# Patient Record
Sex: Female | Born: 1995 | Race: White | Hispanic: No | Marital: Single | Smoking: Never smoker
Health system: Southern US, Community
[De-identification: ages and names within clinical notes are randomized; demographics above are authoritative.]

## PROBLEM LIST (undated history)

## (undated) DIAGNOSIS — R87619 Unspecified abnormal cytological findings in specimens from cervix uteri: Secondary | ICD-10-CM

## (undated) HISTORY — DX: Unspecified abnormal cytological findings in specimens from cervix uteri: R87.619

## (undated) HISTORY — PX: NO PAST SURGERIES: SHX2092

---
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
## 2015-04-20 ENCOUNTER — Ambulatory Visit: Payer: Medicaid Other | Admitting: Obstetrics & Gynecology

## 2015-05-05 ENCOUNTER — Ambulatory Visit (INDEPENDENT_AMBULATORY_CARE_PROVIDER_SITE_OTHER): Payer: Medicaid Other | Admitting: Obstetrics & Gynecology

## 2015-05-05 ENCOUNTER — Other Ambulatory Visit (HOSPITAL_COMMUNITY)
Admission: RE | Admit: 2015-05-05 | Discharge: 2015-05-05 | Disposition: A | Discharge status: Home / Self Care | Payer: Medicaid Other | Source: Ambulatory Visit | Attending: Obstetrics & Gynecology | Admitting: Obstetrics & Gynecology

## 2015-05-05 ENCOUNTER — Encounter: Payer: Self-pay | Admitting: Obstetrics & Gynecology

## 2015-05-05 VITALS — BP 120/60 | HR 76 | Wt 136.0 lb

## 2015-05-05 DIAGNOSIS — Z01411 Encounter for gynecological examination (general) (routine) with abnormal findings: Secondary | ICD-10-CM | POA: Diagnosis not present

## 2015-05-05 DIAGNOSIS — R87619 Unspecified abnormal cytological findings in specimens from cervix uteri: Secondary | ICD-10-CM | POA: Diagnosis not present

## 2015-05-05 DIAGNOSIS — Z124 Encounter for screening for malignant neoplasm of cervix: Secondary | ICD-10-CM

## 2015-05-05 DIAGNOSIS — R87611 Atypical squamous cells cannot exclude high grade squamous intraepithelial lesion on cytologic smear of cervix (ASC-H): Secondary | ICD-10-CM

## 2015-05-05 MED ORDER — MEDROXYPROGESTERONE ACETATE 104 MG/0.65ML ~~LOC~~ SUSP: 104.0000 mg | SUBCUTANEOUS | Status: DC

## 2015-05-05 NOTE — Addendum Note (Signed)
Addended by: Criss AlvinePULLIAM, CHRYSTAL G on: 05/05/2015 12:04 PM   Modules accepted: Orders

## 2015-05-05 NOTE — Progress Notes (Signed)
Patient ID: Carolyn Ellis, female   DOB: 02/24/1996, 19 y.o.   MRN: [id number] Colposcopy Procedure Note:  Colposcopy Procedure Note  Indications: Pap smear 6 months ago showed: ASC cannot exclude high grade lesion [facility]). The prior pap showed ASC cannot exclude high grade lesion [facility]).  Prior cervical/vaginal disease: CIN 1. Prior cervical treatment: no treatment.  Smoker:  No. New sexual partner:  No.  : time frame:    History of abnormal Pap: yes  Procedure Details  The risks and benefits of the procedure and Written informed consent obtained.  Speculum placed in vagina and excellent visualization of cervix achieved, cervix swabbed x 3 with acetic acid solution.  Findings: Pap performed today  Specimens: Pap  Complications: none.  Plan: Follow colpo if needed

## 2015-05-06 LAB — CYTOLOGY - PAP

## 2016-02-21 ENCOUNTER — Ambulatory Visit (INDEPENDENT_AMBULATORY_CARE_PROVIDER_SITE_OTHER): Payer: Medicaid Other | Admitting: Obstetrics & Gynecology

## 2016-02-21 ENCOUNTER — Encounter: Payer: Self-pay | Admitting: Obstetrics & Gynecology

## 2016-02-21 VITALS — BP 110/74 | HR 82 | Ht 65.0 in | Wt 156.0 lb

## 2016-02-21 DIAGNOSIS — Z30015 Encounter for initial prescription of vaginal ring hormonal contraceptive: Secondary | ICD-10-CM | POA: Diagnosis not present

## 2016-02-21 DIAGNOSIS — Z3009 Encounter for other general counseling and advice on contraception: Secondary | ICD-10-CM

## 2016-02-21 DIAGNOSIS — Z308 Encounter for other contraceptive management: Secondary | ICD-10-CM

## 2016-02-21 MED ORDER — ETONOGESTREL-ETHINYL ESTRADIOL 0.12-0.015 MG/24HR VA RING: VAGINAL_RING | VAGINAL | Status: DC

## 2016-02-21 NOTE — Progress Notes (Signed)
Patient ID: Carolyn Ellis, female   DOB: 11/23/1995, 20 y.o.   MRN: [id number] Blood pressure 110/74, pulse 82, height 5\' 5"  (1.651 m), weight 156 lb (70.761 kg).  Chief Complaint  Patient presents with  . discuss switching from Depo to Nuvaring    Discussed at length regarding depo vs nuva ring  Wants to try it   Discussed the use and changing, she understands     Face to face time:  15 minutes  Greater than 50% of the visit time was spent in counseling and coordination of care with the patient.  The summary and outline of the counseling and care coordination is summarized in the note above.   All questions were answered.

## 2017-02-25 ENCOUNTER — Other Ambulatory Visit: Payer: Self-pay | Admitting: Obstetrics & Gynecology

## 2019-12-08 ENCOUNTER — Telehealth: Payer: Self-pay | Admitting: Adult Health

## 2019-12-08 NOTE — Telephone Encounter (Signed)
Called patient regarding appointment and the following message was left: ° ° °We have you scheduled for an upcoming appointment at our office. At this time, we are still not allowing visitors during the appointment. Otherwise, support persons should remain outside until the visit is complete.  ° °We ask if you are sick, have any symptoms of COVID, have had any exposure to anyone suspected or confirmed of having COVID-19, or are awaiting test results for COVID-19, to call our office as we [place] need to reschedule you for a virtual visit or schedule your appointment for a later date.   ° °Please know we will ask you these questions or similar questions when you arrive for your appointment and understand this is how we are keeping everyone safe.   ° °Also,to keep you safe, please use the provided hand sanitizer when you enter the office. We are asking everyone in the office to wear a mask to help prevent the spread of °germs. If you have a mask of your own, please wear it to your appointment, if not, we are happy to provide one for you. ° °Thank you for understanding and your cooperation.  ° ° °[facility] Staff ° ° ° ° ° °

## 2019-12-09 ENCOUNTER — Encounter: Payer: Self-pay | Admitting: Adult Health

## 2019-12-09 ENCOUNTER — Other Ambulatory Visit: Payer: Self-pay

## 2019-12-09 ENCOUNTER — Ambulatory Visit (INDEPENDENT_AMBULATORY_CARE_PROVIDER_SITE_OTHER): Payer: 59 | Admitting: Adult Health

## 2019-12-09 VITALS — BP 127/79 | HR 100 | Ht 65.0 in | Wt 178.0 lb

## 2019-12-09 DIAGNOSIS — O3680X0 Pregnancy with inconclusive fetal viability, not applicable or unspecified: Secondary | ICD-10-CM | POA: Diagnosis not present

## 2019-12-09 DIAGNOSIS — Z3A01 Less than 8 weeks gestation of pregnancy: Secondary | ICD-10-CM

## 2019-12-09 DIAGNOSIS — Z3201 Encounter for pregnancy test, result positive: Secondary | ICD-10-CM | POA: Diagnosis not present

## 2019-12-09 LAB — POCT URINE PREGNANCY: Preg Test, Ur: POSITIVE — AB

## 2019-12-09 NOTE — Progress Notes (Signed)
  Subjective:     Patient ID: Carolyn Ellis, female   DOB: 05/06/1996, 24 y.o.   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 PCP is [facility].   Review of Systems +missed period,3+HPTs  +cramping, no bleeding Reviewed past medical,surgical, social and family history. Reviewed medications and allergies.     Objective:   Physical Exam BP 127/79 (BP Location: Left Arm, Patient Position: Sitting, Cuff Size: Normal)   Pulse 100   Ht 5\' 5"  (1.651 m)   Wt 178 lb (80.7 kg)   LMP 10/30/2019   BMI 29.62 kg/m UPT +, about 5+5 weeks by LMP with EDD 08/05/20. Skin warm and dry. Neck: mid line trachea, normal thyroid, good ROM, no lymphadenopathy noted. Lungs: clear to ausculation bilaterally. Cardiovascular: regular rate and rhythm. Abdomen is soft and non tender. Fall risk is low PHQ 2 score is 0.     Assessment:     1. Pregnancy examination or test, positive result Take PN Gummies  2. Less than [redacted] weeks gestation of pregnancy  3. Encounter to determine fetal viability of pregnancy, single or unspecified fetus Return in about 2 weeks for dating 10/05/20     Plan:     Review handout by [facility]  Note given for no lifting over 25 lbs

## 2019-12-14 ENCOUNTER — Telehealth: Payer: Self-pay | Admitting: *Deleted

## 2019-12-14 NOTE — Telephone Encounter (Signed)
Patient left message having lower stomach pains and missed four days of work. Please call.

## 2019-12-14 NOTE — Telephone Encounter (Signed)
Patient states she is having lower right side. Denies bleeding. Pain seems to be about the same but is unbearable.  Advised patient she needs an u/s. Will put on schedule for 10:30 in the am but should go to the hospital if pain worsens.  Pt verbalized understanding.

## 2019-12-15 ENCOUNTER — Ambulatory Visit (INDEPENDENT_AMBULATORY_CARE_PROVIDER_SITE_OTHER): Payer: 59

## 2019-12-15 ENCOUNTER — Other Ambulatory Visit: Payer: Self-pay

## 2019-12-15 DIAGNOSIS — O3680X0 Pregnancy with inconclusive fetal viability, not applicable or unspecified: Secondary | ICD-10-CM | POA: Diagnosis not present

## 2019-12-15 DIAGNOSIS — Z3A01 Less than 8 weeks gestation of pregnancy: Secondary | ICD-10-CM

## 2019-12-15 NOTE — Progress Notes (Signed)
[place] 5+5 wks,single IUP with ys,crl 2.8 mm,normal ovaries,fhr 107 bpm

## 2019-12-23 ENCOUNTER — Other Ambulatory Visit: Payer: 59

## 2019-12-24 ENCOUNTER — Other Ambulatory Visit: Payer: Self-pay

## 2019-12-24 ENCOUNTER — Inpatient Hospital Stay (HOSPITAL_COMMUNITY)
Admission: AD | Admit: 2019-12-24 | Discharge: 2019-12-25 | Disposition: A | Discharge status: Home / Self Care | Payer: 59 | Source: Ambulatory Visit | Attending: Obstetrics and Gynecology | Admitting: Obstetrics and Gynecology

## 2019-12-24 ENCOUNTER — Encounter (HOSPITAL_COMMUNITY): Payer: Self-pay | Admitting: Obstetrics and Gynecology

## 2019-12-24 ENCOUNTER — Inpatient Hospital Stay (HOSPITAL_COMMUNITY): Payer: 59

## 2019-12-24 DIAGNOSIS — Z3A01 Less than 8 weeks gestation of pregnancy: Secondary | ICD-10-CM | POA: Insufficient documentation

## 2019-12-24 DIAGNOSIS — O209 Hemorrhage in early pregnancy, unspecified: Secondary | ICD-10-CM | POA: Diagnosis not present

## 2019-12-24 DIAGNOSIS — O26851 Spotting complicating pregnancy, first trimester: Secondary | ICD-10-CM | POA: Diagnosis present

## 2019-12-24 DIAGNOSIS — Z880 Allergy status to penicillin: Secondary | ICD-10-CM | POA: Diagnosis not present

## 2019-12-24 LAB — CBC
HCT: 37.8 % (ref 36.0–46.0)
Hemoglobin: 12.5 g/dL (ref 12.0–15.0)
MCH: 28.3 pg (ref 26.0–34.0)
MCHC: 33.1 g/dL (ref 30.0–36.0)
MCV: 85.5 fL (ref 80.0–100.0)
Platelets: 269 10*3/uL (ref 150–400)
RBC: 4.42 MIL/uL (ref 3.87–5.11)
RDW: 13.4 % (ref 11.5–15.5)
WBC: 12.4 10*3/uL — ABNORMAL HIGH (ref 4.0–10.5)
nRBC: 0 % (ref 0.0–0.2)

## 2019-12-24 NOTE — Progress Notes (Signed)
Swabs obtained blindly without spec

## 2019-12-24 NOTE — MAU Provider Note (Signed)
Chief Complaint: Vaginal Bleeding   First Provider Initiated Contact with Patient 12/24/19 2313        SUBJECTIVE HPI: Carolyn Ellis is a 24 y.o. G1P0 at [redacted]w[redacted]d by LMP who presents to maternity admissions reporting pink spotting yesterday and again today. Has had an early [place] in office at [redacted]w[redacted]d.  No cramping today. . She denies vaginal bleeding, vaginal itching/burning, urinary symptoms, h/a, dizziness, n/v, or fever/chills.    Vaginal Bleeding The patient's primary symptoms include vaginal bleeding. The patient's pertinent negatives include no genital itching, genital lesions, genital odor or pelvic pain. This is a recurrent problem. The current episode started yesterday. The problem occurs intermittently. The problem has been unchanged. The patient is experiencing no pain. She is pregnant. Pertinent negatives include no abdominal pain, back pain, chills, constipation, diarrhea, dysuria, fever, nausea or vomiting. The vaginal discharge was bloody (pink). The vaginal bleeding is spotting. She has not been passing clots. She has not been passing tissue. Nothing aggravates the symptoms. She has tried nothing for the symptoms.   RN Note: Yesterday x1 and today x 1 had pink on tissue when wiped. No pain. Just wanted to be sure baby is ok  Past Medical History:  Diagnosis Date  . Abnormal Pap smear of cervix    ASCUS   No past surgical history on file. Social History   Socioeconomic History  . Marital status: Single    Spouse name: Not on file  . Number of children: Not on file  . Years of education: Not on file  . Highest education level: Not on file  Occupational History  . Not on file  Tobacco Use  . Smoking status: Never Smoker  . Smokeless tobacco: Never Used  Substance and Sexual Activity  . Alcohol use: No    Alcohol/week: 0.0 standard drinks  . Drug use: No  . Sexual activity: Yes    Birth control/protection: None  Other Topics Concern  . Not on file  Social History Narrative   . Not on file   Social Determinants of Health   Financial Resource Strain:   . Difficulty of Paying Living Expenses: Not on file  Food Insecurity:   . Worried About Programme researcher, broadcasting/film/video in the Last Year: Not on file  . Ran Out of Food in the Last Year: Not on file  Transportation Needs:   . Lack of Transportation (Medical): Not on file  . Lack of Transportation (Non-Medical): Not on file  Physical Activity:   . Days of Exercise per Week: Not on file  . Minutes of Exercise per Session: Not on file  Stress:   . Feeling of Stress : Not on file  Social Connections:   . Frequency of Communication with Friends and Family: Not on file  . Frequency of Social Gatherings with Friends and Family: Not on file  . Attends Religious Services: Not on file  . Active Member of Clubs or Organizations: Not on file  . Attends Banker Meetings: Not on file  . Marital Status: Not on file  Intimate Partner Violence:   . Fear of Current or Ex-Partner: Not on file  . Emotionally Abused: Not on file  . Physically Abused: Not on file  . Sexually Abused: Not on file   No current facility-administered medications on file prior to encounter.   No current outpatient medications on file prior to encounter.   Allergies  Allergen Reactions  . Augmentin [Amoxicillin-Pot Clavulanate]   . Penicillins Rash  I have reviewed patient's Past Medical Hx, Surgical Hx, Family Hx, Social Hx, medications and allergies.   ROS:  Review of Systems  Constitutional: Negative for chills and fever.  Gastrointestinal: Negative for abdominal pain, constipation, diarrhea, nausea and vomiting.  Genitourinary: Positive for vaginal bleeding. Negative for dysuria and pelvic pain.  Musculoskeletal: Negative for back pain.   Review of Systems  Other systems negative   Physical Exam  Physical Exam Patient Vitals for the past 24 hrs:  BP Temp Pulse Resp Height Weight  12/24/19 2258 (!) 110/55 -- 93 -- -- --   12/24/19 2256 -- 99.1 F (37.3 C) -- 16 5\' 5"  (1.651 m) 80.3 kg   Constitutional: Well-developed, well-nourished female in no acute distress.  Cardiovascular: normal rate Respiratory: normal effort GI: Abd soft, non-tender. Pos BS x 4 MS: Extremities nontender, no edema, normal ROM Neurologic: Alert and oriented x 4.  GU: Neg CVAT.  PELVIC EXAM: Cervix closed, scant pink discharge, vaginal walls and external genitalia normal  LAB RESULTS Results for orders placed or performed during the hospital encounter of 12/24/19 (from the past 24 hour(s))  Wet prep, genital     Status: Abnormal   Collection Time: 12/24/19 11:16 PM   Specimen: Vaginal  Result Value Ref Range   Yeast Wet Prep HPF POC NONE SEEN NONE SEEN   Trich, Wet Prep NONE SEEN NONE SEEN   Clue Cells Wet Prep HPF POC NONE SEEN NONE SEEN   WBC, Wet Prep HPF POC FEW (A) NONE SEEN   Sperm NONE SEEN   ABO/Rh     Status: None   Collection Time: 12/24/19 11:22 PM  Result Value Ref Range   ABO/RH(D) O POS    No rh immune globuloin      NOT A RH IMMUNE GLOBULIN CANDIDATE, PT RH POSITIVE Performed at [facility], [place] [address], [place], [place] [address]   CBC     Status: Abnormal   Collection Time: 12/24/19 11:22 PM  Result Value Ref Range   WBC 12.4 (H) 4.0 - 10.5 K/uL   RBC 4.42 3.87 - 5.11 MIL/uL   Hemoglobin 12.5 12.0 - 15.0 g/dL   HCT 37.8 36.0 - 46.0 %   MCV 85.5 80.0 - 100.0 fL   MCH 28.3 26.0 - 34.0 pg   MCHC 33.1 30.0 - 36.0 g/dL   RDW 13.4 11.5 - 15.5 %   Platelets 269 150 - 400 K/uL   nRBC 0.0 0.0 - 0.2 %  Urinalysis, Routine w reflex microscopic     Status: Abnormal   Collection Time: 12/24/19 11:38 PM  Result Value Ref Range   Color, Urine YELLOW YELLOW   APPearance CLEAR CLEAR   Specific Gravity, Urine 1.018 1.005 - 1.030   pH 6.0 5.0 - 8.0   Glucose, UA NEGATIVE NEGATIVE mg/dL   Hgb urine dipstick MODERATE (A) NEGATIVE   Bilirubin Urine NEGATIVE NEGATIVE   Ketones, ur NEGATIVE NEGATIVE  mg/dL   Protein, ur NEGATIVE NEGATIVE mg/dL   Nitrite NEGATIVE NEGATIVE   Leukocytes,Ua NEGATIVE NEGATIVE   RBC / HPF 0-5 0 - 5 RBC/hpf   WBC, UA 0-5 0 - 5 WBC/hpf   Bacteria, UA NONE SEEN NONE SEEN   Squamous Epithelial / LPF 0-5 0 - 5   Mucus PRESENT      IMAGING US OB Transvaginal  Result Date: 12/25/2019 CLINICAL DATA:  Vaginal bleeding EXAM: TRANSVAGINAL OB ULTRASOUND TECHNIQUE: Transvaginal ultrasound was performed for complete evaluation of the gestation as  well as the maternal uterus, adnexal regions, and pelvic cul-de-sac. COMPARISON:  None. FINDINGS: Intrauterine gestational sac: Single Yolk sac:  Visualized. Embryo:  Visualized. Cardiac Activity: Visualized. Heart Rate: 148 bpm CRL:   9.4 mm   6 w 6 d                  [place] EDC: 08/12/2020 Subchorionic hemorrhage:  None visualized. Maternal uterus/adnexae: Right ovary measures 3.0 x 1.9 x 2.1 cm and the left ovary measures 2.0 x 1.3 x 2.4 cm. No free fluid. IMPRESSION: 1. Single live intrauterine pregnancy as above, estimated age 32 weeks and 6 days. Electronically Signed   By: Sharlet Salina M.D.   On: 12/25/2019 00:23   MAU Management/MDM: Ordered ABO/Rh. Pelvic exam and cultures done Will check Ultrasound to rule out ectopic. This bleeding can represent a normal pregnancy with bleeding, spontaneous abortion or even an ectopic which can be life-threatening.  The process as listed above helps to determine which of these is present.  Reviewed results.  US showed live single IUP. No evidence of SCH, but there Schillo be a tiny one causing bleeding.  The fact that the baby retained a heartbeat, is reassuring the bleeding is minor  ASSESSMENT [place] Live IUP at [redacted]w[redacted]d by LMP Bleeding in the first trimester  PLAN Discharge home Pelvic rest [place] expect continued spotting for a week or two  Pt stable at time of discharge. Encouraged to return here or to other Urgent Care/ED if she develops worsening of symptoms, increase in pain, fever,  or other concerning symptoms.    Wynelle Bourgeois CNM, MSN Certified Nurse-Midwife 12/24/2019  11:13 PM

## 2019-12-24 NOTE — MAU Note (Signed)
Yesterday x1 and today x 1 had pink on tissue when wiped. No pain. Just wanted to be sure baby is ok

## 2019-12-25 LAB — URINALYSIS, ROUTINE W REFLEX MICROSCOPIC
Bacteria, UA: NONE SEEN
Bilirubin Urine: NEGATIVE
Glucose, UA: NEGATIVE mg/dL
Ketones, ur: NEGATIVE mg/dL
Leukocytes,Ua: NEGATIVE
Nitrite: NEGATIVE
Protein, ur: NEGATIVE mg/dL
Specific Gravity, Urine: 1.018 (ref 1.005–1.030)
pH: 6 (ref 5.0–8.0)

## 2019-12-25 LAB — WET PREP, GENITAL
Clue Cells Wet Prep HPF POC: NONE SEEN
Sperm: NONE SEEN
Trich, Wet Prep: NONE SEEN
Yeast Wet Prep HPF POC: NONE SEEN

## 2019-12-25 LAB — GC/CHLAMYDIA PROBE AMP (~~LOC~~) NOT AT ARMC
Chlamydia: NEGATIVE
Comment: NEGATIVE
Comment: NORMAL
Neisseria Gonorrhea: NEGATIVE

## 2019-12-25 LAB — ABO/RH: ABO/RH(D): O POS

## 2019-12-25 NOTE — Discharge Instructions (Signed)
Vaginal Bleeding During Pregnancy, Second Trimester  A small amount of bleeding (spotting) from the vagina is common during pregnancy. Sometimes the bleeding is normal and is not a sign of problems. In some other cases, it is a sign of something serious. Tell your doctor right away if there is any bleeding from your vagina. Follow these instructions at home: Activity  Follow your doctor's instructions about how active you can be.  If needed, make plans for someone to help with your normal activities.  Do not exercise or do activities that take a lot of effort until your doctor says that this is safe.  Do not lift anything that is heavier than 10 lb (4.5 kg) until your doctor says that this is safe.  Do not have sex or orgasms until your doctor says that this is safe. Medicines  Take over-the-counter and prescription medicines only as told by your doctor.  Do not take aspirin. It can cause bleeding. General instructions  Watch your condition for any changes.  Write down: ? The number of pads you use each day. ? How often you change pads. ? How soaked your pads are.  Do not use tampons.  Do not douche.  If you pass any tissue from your vagina, save it to show to your doctor.  Keep all follow-up visits as told by your doctor. This is important. Contact a doctor if:  You have bleeding in the vagina at any time during pregnancy.  You have cramps.  You have a fever that does not get better with medicine. Get help right away if:  You have very bad cramps in your back or belly (abdomen).  You have contractions.  You have chills.  You pass large clots or a lot of tissue from your vagina.  Your bleeding gets worse.  You feel light-headed.  You feel weak.  You pass out (faint).  You are leaking fluid from your vagina.  You have a gush of fluid from your vagina. Summary  Sometimes vaginal bleeding during pregnancy is normal and is not a problem. Sometimes it [place]  be a sign of something serious.  Tell your doctor about any bleeding from your vagina right away.  Follow your doctor's instructions about how active you can be. You [place] need someone to help you with your normal activities. This information is not intended to replace advice given to you by your health care provider. Make sure you discuss any questions you have with your health care provider. Document Revised: 02/03/2019 Document Reviewed: 01/16/2017 Elsevier Patient Education  2020 [employer]vier Inc.  

## 2019-12-25 NOTE — MAU Note (Signed)
Written and verbal d/c instructions given and understanding voiced. 

## 2020-01-27 ENCOUNTER — Telehealth: Payer: Self-pay | Admitting: Advanced Practice Midwife

## 2020-01-27 NOTE — Telephone Encounter (Signed)
Called patient regarding appointment and the following message was left: ° ° °We have you scheduled for an upcoming appointment at our office. At this time, we are still not allowing visitors during the appointment. Otherwise, support persons should remain outside until the visit is complete.  ° °We ask if you are sick, have any symptoms of COVID, have had any exposure to anyone suspected or confirmed of having COVID-19, or are awaiting test results for COVID-19, to call our office as we [place] need to reschedule you for a virtual visit or schedule your appointment for a later date.   ° °Please know we will ask you these questions or similar questions when you arrive for your appointment and understand this is how we are keeping everyone safe.   ° °Also,to keep you safe, please use the provided hand sanitizer when you enter the office. We are asking everyone in the office to wear a mask to help prevent the spread of °germs. If you have a mask of your own, please wear it to your appointment, if not, we are happy to provide one for you. ° °Thank you for understanding and your cooperation.  ° ° °[facility] Staff ° ° ° ° ° °

## 2020-01-28 ENCOUNTER — Other Ambulatory Visit: Payer: Self-pay | Admitting: Obstetrics and Gynecology

## 2020-01-28 DIAGNOSIS — Z3682 Encounter for antenatal screening for nuchal translucency: Secondary | ICD-10-CM

## 2020-01-28 DIAGNOSIS — Z3400 Encounter for supervision of normal first pregnancy, unspecified trimester: Secondary | ICD-10-CM | POA: Insufficient documentation

## 2020-02-01 ENCOUNTER — Other Ambulatory Visit (HOSPITAL_COMMUNITY)
Admission: RE | Admit: 2020-02-01 | Discharge: 2020-02-01 | Disposition: A | Discharge status: Home / Self Care | Payer: 59 | Source: Ambulatory Visit | Attending: Advanced Practice Midwife | Admitting: Advanced Practice Midwife

## 2020-02-01 ENCOUNTER — Encounter: Payer: Self-pay | Admitting: Advanced Practice Midwife

## 2020-02-01 ENCOUNTER — Other Ambulatory Visit: Payer: Self-pay

## 2020-02-01 ENCOUNTER — Ambulatory Visit (INDEPENDENT_AMBULATORY_CARE_PROVIDER_SITE_OTHER): Payer: 59 | Admitting: Advanced Practice Midwife

## 2020-02-01 ENCOUNTER — Ambulatory Visit: Payer: 59 | Admitting: *Deleted

## 2020-02-01 ENCOUNTER — Ambulatory Visit (INDEPENDENT_AMBULATORY_CARE_PROVIDER_SITE_OTHER): Payer: 59

## 2020-02-01 VITALS — BP 107/67 | HR 85 | Wt 171.0 lb

## 2020-02-01 DIAGNOSIS — Z3A12 12 weeks gestation of pregnancy: Secondary | ICD-10-CM

## 2020-02-01 DIAGNOSIS — Z3401 Encounter for supervision of normal first pregnancy, first trimester: Secondary | ICD-10-CM

## 2020-02-01 DIAGNOSIS — Z3A13 13 weeks gestation of pregnancy: Secondary | ICD-10-CM

## 2020-02-01 DIAGNOSIS — Z3402 Encounter for supervision of normal first pregnancy, second trimester: Secondary | ICD-10-CM

## 2020-02-01 DIAGNOSIS — Z3682 Encounter for antenatal screening for nuchal translucency: Secondary | ICD-10-CM | POA: Diagnosis not present

## 2020-02-01 LAB — POCT URINALYSIS DIPSTICK OB
Blood, UA: NEGATIVE
Glucose, UA: NEGATIVE
Ketones, UA: NEGATIVE
Leukocytes, UA: NEGATIVE
Nitrite, UA: NEGATIVE
POC,PROTEIN,UA: NEGATIVE

## 2020-02-01 MED ORDER — ONDANSETRON 4 MG PO TBDP: 4.0000 mg | ORAL_TABLET | Freq: Four times a day (QID) | ORAL | 2 refills | Status: DC | PRN

## 2020-02-01 MED ORDER — BLOOD PRESSURE MONITOR MISC: 0 refills | Status: DC

## 2020-02-01 NOTE — Patient Instructions (Addendum)
Laurell Roof Henigan, I greatly value your feedback.  If you receive a survey following your visit with [place] today, we appreciate you taking the time to fill it out.  Thanks, Nigel Berthold, DNP, [place]!!! It is now [place] at [facility] ([place], [place] [address]) Entrance located off of [address] parking   Nausea & Vomiting  Have saltine crackers or pretzels by your bed and eat a few bites before you raise your head out of bed in the morning  Eat small frequent meals throughout the day instead of large meals  Drink plenty of fluids throughout the day to stay hydrated, just don't drink a lot of fluids with your meals.  This can make your stomach fill up faster making you feel sick  Do not brush your teeth right after you eat  Products with real ginger are good for nausea, like ginger ale and ginger hard candy Make sure it says made with real ginger!  Sucking on sour candy like lemon heads is also good for nausea  If your prenatal vitamins make you nauseated, take them at night so you will sleep through the nausea  Sea Bands  If you feel like you need medicine for the nausea & vomiting please let us know  If you are unable to keep any fluids or food down please let us know   Constipation  Drink plenty of fluid, preferably water, throughout the day  Eat foods high in fiber such as fruits, vegetables, and grains  Exercise, such as walking, is a good way to keep your bowels regular  Drink warm fluids, especially warm prune juice, or decaf coffee  Eat a 1/2 cup of real oatmeal (not instant), 1/2 cup applesauce, and 1/2-1 cup warm prune juice every day  If needed, you Necaise take Colace (docusate sodium) stool softener once or twice a day to help keep the stool soft.   If you still are having problems with constipation, you Ortmann take Miralax once daily as needed to help keep your bowels regular.   Home  Blood Pressure Monitoring for Patients   Your provider has recommended that you check your blood pressure (BP) at least once a week at home. If you do not have a blood pressure cuff at home, one will be provided for you. Contact your provider if you have not received your monitor within 1 week.   Helpful Tips for Accurate Home Blood Pressure Checks  . Don't smoke, exercise, or drink caffeine 30 minutes before checking your BP . Use the restroom before checking your BP (a full bladder can raise your pressure) . Relax in a comfortable upright chair . Feet on the ground . Left arm resting comfortably on a flat surface at the level of your heart . Legs uncrossed . Back supported . Sit quietly and don't talk . Place the cuff on your bare arm . Adjust snuggly, so that only two fingertips can fit between your skin and the top of the cuff . Check 2 readings separated by at least one minute . Keep a log of your BP readings . For a visual, please reference this diagram: http://ccnc.care/bpdiagram  Provider Name: [facility]     Phone: [phone]  Zone 1: ALL CLEAR  Continue to monitor your symptoms:  . BP reading is less than 140 (top number) or less than 90 (bottom number)  . No right upper stomach  pain . No headaches or seeing spots . No feeling nauseated or throwing up . No swelling in face and hands  Zone 2: CAUTION Call your doctor's office for any of the following:  . BP reading is greater than 140 (top number) or greater than 90 (bottom number)  . Stomach pain under your ribs in the middle or right side . Headaches or seeing spots . Feeling nauseated or throwing up . Swelling in face and hands  Zone 3: EMERGENCY  Seek immediate medical care if you have any of the following:  . BP reading is greater than160 (top number) or greater than 110 (bottom number) . Severe headaches not improving with Tylenol . Serious difficulty catching your breath . Any worsening symptoms  from Zone 2    First Trimester of Pregnancy The first trimester of pregnancy is from week 1 until the end of week 12 (months 1 through 3). A week after a sperm fertilizes an egg, the egg will implant on the wall of the uterus. This embryo will begin to develop into a baby. Genes from you and your partner are forming the baby. The female genes determine whether the baby is a boy or a girl. At 6-8 weeks, the eyes and face are formed, and the heartbeat can be seen on ultrasound. At the end of 12 weeks, all the baby's organs are formed.  Now that you are pregnant, you will want to do everything you can to have a healthy baby. Two of the most important things are to get good prenatal care and to follow your health care provider's instructions. Prenatal care is all the medical care you receive before the baby's birth. This care will help prevent, find, and treat any problems during the pregnancy and childbirth. BODY CHANGES Your body goes through many changes during pregnancy. The changes vary from woman to woman.   You Snelling gain or lose a couple of pounds at first.  You Sochacki feel sick to your stomach (nauseous) and throw up (vomit). If the vomiting is uncontrollable, call your health care provider.  You Cerrato tire easily.  You Pennino develop headaches that can be relieved by medicines approved by your health care provider.  You Hartog urinate more often. Painful urination Rothbauer mean you have a bladder infection.  You Gazzola develop heartburn as a result of your pregnancy.  You Yadao develop constipation because certain hormones are causing the muscles that push waste through your intestines to slow down.  You Mcgowan develop hemorrhoids or swollen, bulging veins (varicose veins).  Your breasts Giel begin to grow larger and become tender. Your nipples Eaddy stick out more, and the tissue that surrounds them (areola) Ogden become darker.  Your gums Dowdell bleed and Hendel be sensitive to brushing and flossing.  Dark spots or  blotches (chloasma, mask of pregnancy) Lukins develop on your face. This will likely fade after the baby is born.  Your menstrual periods will stop.  You Hefner have a loss of appetite.  You Weatherspoon develop cravings for certain kinds of food.  You Federici have changes in your emotions from day to day, such as being excited to be pregnant or being concerned that something Gordy go wrong with the pregnancy and baby.  You Pellecchia have more vivid and strange dreams.  You Firestine have changes in your hair. These can include thickening of your hair, rapid growth, and changes in texture. Some women also have hair loss during or after pregnancy, or hair that  feels dry or thin. Your hair will most likely return to normal after your baby is born. WHAT TO EXPECT AT YOUR PRENATAL VISITS During a routine prenatal visit:  You will be weighed to make sure you and the baby are growing normally.  Your blood pressure will be taken.  Your abdomen will be measured to track your baby's growth.  The fetal heartbeat will be listened to starting around week 10 or 12 of your pregnancy.  Test results from any previous visits will be discussed. Your health care provider Tumminello ask you:  How you are feeling.  If you are feeling the baby move.  If you have had any abnormal symptoms, such as leaking fluid, bleeding, severe headaches, or abdominal cramping.  If you have any questions. Other tests that Mcnicholas be performed during your first trimester include:  Blood tests to find your blood type and to check for the presence of any previous infections. They will also be used to check for low iron levels (anemia) and Rh antibodies. Later in the pregnancy, blood tests for diabetes will be done along with other tests if problems develop.  Urine tests to check for infections, diabetes, or protein in the urine.  An ultrasound to confirm the proper growth and development of the baby.  An amniocentesis to check for possible genetic  problems.  Fetal screens for spina bifida and Down syndrome.  You Gillooly need other tests to make sure you and the baby are doing well. HOME CARE INSTRUCTIONS  Medicines  Follow your health care provider's instructions regarding medicine use. Specific medicines Bromwell be either safe or unsafe to take during pregnancy.  Take your prenatal vitamins as directed.  If you develop constipation, try taking a stool softener if your health care provider approves. Diet  Eat regular, well-balanced meals. Choose a variety of foods, such as meat or vegetable-based protein, fish, milk and low-fat dairy products, vegetables, fruits, and whole grain breads and cereals. Your health care provider will help you determine the amount of weight gain that is right for you.  Avoid raw meat and uncooked cheese. These carry germs that can cause birth defects in the baby.  Eating four or five small meals rather than three large meals a day Vermeer help relieve nausea and vomiting. If you start to feel nauseous, eating a few soda crackers can be helpful. Drinking liquids between meals instead of during meals also seems to help nausea and vomiting.  If you develop constipation, eat more high-fiber foods, such as fresh vegetables or fruit and whole grains. Drink enough fluids to keep your urine clear or pale yellow. Activity and Exercise  Exercise only as directed by your health care provider. Exercising will help you:  Control your weight.  Stay in shape.  Be prepared for labor and delivery.  Experiencing pain or cramping in the lower abdomen or low back is a good sign that you should stop exercising. Check with your health care provider before continuing normal exercises.  Try to avoid standing for long periods of time. Move your legs often if you must stand in one place for a long time.  Avoid heavy lifting.  Wear low-heeled shoes, and practice good posture.  You Mulligan continue to have sex unless your health care  provider directs you otherwise. Relief of Pain or Discomfort  Wear a good support bra for breast tenderness.    Take warm sitz baths to soothe any pain or discomfort caused by hemorrhoids. Use hemorrhoid cream  if your health care provider approves.    Rest with your legs elevated if you have leg cramps or low back pain.  If you develop varicose veins in your legs, wear support hose. Elevate your feet for 15 minutes, 3-4 times a day. Limit salt in your diet. Prenatal Care  Schedule your prenatal visits by the twelfth week of pregnancy. They are usually scheduled monthly at first, then more often in the last 2 months before delivery.  Write down your questions. Take them to your prenatal visits.  Keep all your prenatal visits as directed by your health care provider. Safety  Wear your seat belt at all times when driving.  Make a list of emergency phone numbers, including numbers for family, friends, the hospital, and police and fire departments. General Tips  Ask your health care provider for a referral to a local prenatal education class. Begin classes no later than at the beginning of month 6 of your pregnancy.  Ask for help if you have counseling or nutritional needs during pregnancy. Your health care provider can offer advice or refer you to specialists for help with various needs.  Do not use hot tubs, steam rooms, or saunas.  Do not douche or use tampons or scented sanitary pads.  Do not cross your legs for long periods of time.  Avoid cat litter boxes and soil used by cats. These carry germs that can cause birth defects in the baby and possibly loss of the fetus by miscarriage or stillbirth.  Avoid all smoking, herbs, alcohol, and medicines not prescribed by your health care provider. Chemicals in these affect the formation and growth of the baby.  Schedule a dentist appointment. At home, brush your teeth with a soft toothbrush and be gentle when you floss. SEEK MEDICAL  CARE IF:   You have dizziness.  You have mild pelvic cramps, pelvic pressure, or nagging pain in the abdominal area.  You have persistent nausea, vomiting, or diarrhea.  You have a bad smelling vaginal discharge.  You have pain with urination.  You notice increased swelling in your face, hands, legs, or ankles. SEEK IMMEDIATE MEDICAL CARE IF:   You have a fever.  You are leaking fluid from your vagina.  You have spotting or bleeding from your vagina.  You have severe abdominal cramping or pain.  You have rapid weight gain or loss.  You vomit blood or material that looks like coffee grounds.  You are exposed to [place] measles and have never had them.  You are exposed to fifth disease or chickenpox.  You develop a severe headache.  You have shortness of breath.  You have any kind of trauma, such as from a fall or a car accident. Document Released: 10/09/2001 Document Revised: 03/01/2014 Document Reviewed: 08/25/2013 [facility] Patient Information 2015 [place], [place]. This information is not intended to replace advice given to you by your health care provider. Make sure you discuss any questions you have with your health care provider.  Coronavirus (COVID-19) Are you at risk?  Are you at risk for the Coronavirus (COVID-19)?  To be considered HIGH RISK for Coronavirus (COVID-19), you have to meet the following criteria:  . Traveled to [place], [place], [place], [place] or [place]; or in the [place] to [place], [place], [place], or [place]; and have fever, cough, and shortness of breath within the last 2 weeks of travel OR . Been in close contact with a person diagnosed with COVID-19 within the last 2  weeks and have fever, cough, and shortness of breath . IF YOU DO NOT MEET THESE CRITERIA, YOU ARE CONSIDERED LOW RISK FOR COVID-19.  What to do if you are HIGH RISK for COVID-19?  Marland Kitchen If you are having a medical emergency, call 911. . Seek medical care right away.  Before you go to a doctor's office, urgent care or emergency department, call ahead and tell them about your recent travel, contact with someone diagnosed with COVID-19, and your symptoms. You should receive instructions from your physician's office regarding next steps of care.  . When you arrive at healthcare provider, tell the healthcare staff immediately you have returned from visiting [place], [place], [place], [place] or [place]; or traveled in the [place] to [place], [place], [place], or [place]; in the last two weeks or you have been in close contact with a person diagnosed with COVID-19 in the last 2 weeks.   . Tell the health care staff about your symptoms: fever, cough and shortness of breath. . After you have been seen by a medical provider, you will be either: o Tested for (COVID-19) and discharged home on quarantine except to seek medical care if symptoms worsen, and asked to  - Stay home and avoid contact with others until you get your results (4-5 days)  - Avoid travel on public transportation if possible (such as bus, train, or airplane) or o Sent to the Emergency Department by EMS for evaluation, COVID-19 testing, and possible admission depending on your condition and test results.  What to do if you are LOW RISK for COVID-19?  Reduce your risk of any infection by using the same precautions used for avoiding the common cold or flu:  Marland Kitchen Wash your hands often with soap and warm water for at least 20 seconds.  If soap and water are not readily available, use an alcohol-based hand sanitizer with at least 60% alcohol.  . If coughing or sneezing, cover your mouth and nose by coughing or sneezing into the elbow areas of your shirt or coat, into a tissue or into your sleeve (not your hands). . Avoid shaking hands with others and consider head nods or verbal greetings only. . Avoid touching your eyes, nose, or mouth with unwashed hands.  . Avoid close contact with people who are  sick. . Avoid places or events with large numbers of people in one location, like concerts or sporting events. . Carefully consider travel plans you have or are making. . If you are planning any travel outside or inside the [place], visit the CDC's Travelers' Health webpage for the latest health notices. . If you have some symptoms but not all symptoms, continue to monitor at home and seek medical attention if your symptoms worsen. . If you are having a medical emergency, call 911.   [place] / e-Visit: [email or web address]         [facility]: [facility]: [phone]                   [facility]: [phone]     Safe Medications in Pregnancy   Acne: Benzoyl Peroxide Salicylic Acid  Backache/Headache: Tylenol: 2 regular strength every 4 hours OR              2 Extra strength every 6 hours  Colds/Coughs/Allergies: Benadryl (alcohol free) 25 mg every 6 hours as needed Breath right strips Claritin  Cepacol throat lozenges Chloraseptic throat spray Cold-Eeze- up to three times per day Cough drops, alcohol free Flonase (by prescription only) Guaifenesin Mucinex Robitussin DM (plain only, alcohol free) Saline nasal spray/drops Sudafed (pseudoephedrine) & Actifed ** use only after [redacted] weeks gestation and if you do not have high blood pressure Tylenol Vicks Vaporub Zinc lozenges Zyrtec   Constipation: Colace Ducolax suppositories Fleet enema Glycerin suppositories Metamucil Milk of magnesia Miralax Senokot Smooth move tea  Diarrhea: Kaopectate Imodium A-D  *NO pepto Bismol  Hemorrhoids: Anusol Anusol HC Preparation H Tucks  Indigestion: Tums Maalox Mylanta Zantac  Pepcid  Insomnia: Benadryl (alcohol free) 25mg  every 6 hours as needed Tylenol PM Unisom, no Gelcaps  Leg  Cramps: Tums MagGel  Nausea/Vomiting:  Bonine Dramamine Emetrol Ginger extract Sea bands Meclizine  Nausea medication to take during pregnancy:  Unisom (doxylamine succinate 25 mg tablets) Take one tablet daily at bedtime. If symptoms are not adequately controlled, the dose can be increased to a maximum recommended dose of two tablets daily (1/2 tablet in the morning, 1/2 tablet mid-afternoon and one at bedtime). Vitamin B6 100mg  tablets. Take one tablet twice a day (up to 200 mg per day).  Skin Rashes: Aveeno products Benadryl cream or 25mg  every 6 hours as needed Calamine Lotion 1% cortisone cream  Yeast infection: Gyne-lotrimin 7 Monistat 7   **If taking multiple medications, please check labels to avoid duplicating the same active ingredients **take medication as directed on the label ** Do not exceed 4000 mg of tylenol in 24 hours **Do not take medications that contain aspirin or ibuprofen     For Headaches:   Stay well hydrated, drink enough water so that your urine is clear, sometimes if you are dehydrated you can get headaches  Eat small frequent meals and snacks, sometimes if you are hungry you can get headaches  Sometimes you get headaches during pregnancy from the pregnancy hormones  You can try tylenol (1-2 regular strength 325mg  or 1-2 extra strength 500mg ) as directed on the box. The least amount of medication that works is best.   Cool compresses (cool wet washcloth or ice pack) to area of head that is hurting  You can also try drinking a caffeinated drink to see if this will help  If not helping, try below:  For Prevention of Headaches/Migraines:  CoQ10 100mg  three times daily  Vitamin B2 400mg  daily  Magnesium Oxide 400-600mg  daily  If You Get a Bad Headache/Migraine:  Benadryl 25mg    Magnesium Oxide  1 large Gatorade  2 extra strength Tylenol (1,000mg  total)  1 cup coffee or Coke  If this doesn't help please call @  [phone]

## 2020-02-01 NOTE — Progress Notes (Signed)
INITIAL OBSTETRICAL VISIT Patient name: Carolyn Ellis MRN [id number]  Date of birth: Oct 27, 1996 Chief Complaint:   Initial Prenatal Visit (nt/it)  History of Present Illness:   Carolyn Ellis is a 24 y.o. G1P0 Caucasian female at [redacted]w[redacted]d by 6 week [place] with an Estimated Date of Delivery: 08/11/20 being seen today for her initial obstetrical visit.   Her obstetrical history is significant for first baby.   Today she reports no complaints.  Patient's last menstrual period was 10/30/2019. Last pap unsure. Review of Systems:   Pertinent items are noted in HPI Denies cramping/contractions, leakage of fluid, vaginal bleeding, abnormal vaginal discharge w/ itching/odor/irritation, headaches, visual changes, shortness of breath, chest pain, abdominal pain, severe nausea/vomiting, or problems with urination or bowel movements unless otherwise stated above.  Pertinent History Reviewed:  Reviewed past medical,surgical, social, obstetrical and family history.  Reviewed problem list, medications and allergies. OB History  Gravida Para Term Preterm AB Living  1            SAB TAB Ectopic Multiple Live Births               # Outcome Date GA Lbr Len/2nd Weight Sex Delivery Anes PTL Lv  1 Current            Physical Assessment:   Vitals:   02/01/20 1153  BP: 107/67  Pulse: 85  Weight: 171 lb (77.6 kg)  Body mass index is 28.46 kg/m.       Physical Examination:  General appearance - well appearing, and in no distress  Mental status - alert, oriented to person, place, and time  Psych:  She has a normal mood and affect  Skin - warm and dry, normal color, no suspicious lesions noted  Chest - effort normal, all lung fields clear to auscultation bilaterally  Heart - normal rate and regular rhythm  Abdomen - soft, nontender  Extremities:  No swelling or varicosities noted   Pelvic - VULVA: normal appearing vulva with no masses, tenderness or lesions  VAGINA: normal appearing vagina with normal color and  discharge, no lesions  CERVIX: normal appearing cervix without discharge or lesions, no CMT  Thin prep pap is done with HR HPV cotesting    via [place] 12+4 wks,measurements c/w dates,crl 74.80 mm,anterior placenta,normal ovaries,NB present,NT 2.2 mm  No results found for this or any previous visit (from the past 24 hour(s)).  Assessment & Plan:  1) Low-Risk Pregnancy G1P0 at [redacted]w[redacted]d with an Estimated Date of Delivery: 08/11/20   2) Initial OB visit   Meds: No orders of the defined types were placed in this encounter.   Initial labs obtained Continue prenatal vitamins Reviewed n/v relief measures and warning s/s to report Reviewed recommended weight gain based on pre-gravid BMI Encouraged well-balanced diet Watched video for carrier screening/genetic testing:  Genetic Screening discussed First Screen and Integrated Screen: requested Cystic fibrosis screening requested SMA screening requested Fragile X screening requested Ultrasound discussed; fetal survey: requested CCNC completed  Follow-up: Return in about 3 weeks (around 02/22/2020) for OB Mychart visit.   No orders of the defined types were placed in this encounter.   Jacklyn Shell DNP, CNM 02/01/2020 12:01 PM

## 2020-02-01 NOTE — Progress Notes (Addendum)
[place] 12+4 wks,measurements c/w dates,crl 74.80 mm,anterior placenta,normal ovaries,NB present,NT 2.2 mm

## 2020-02-02 LAB — PMP SCREEN PROFILE (10S), URINE
Amphetamine Scrn, Ur: NEGATIVE ng/mL
BARBITURATE SCREEN URINE: NEGATIVE ng/mL
BENZODIAZEPINE SCREEN, URINE: NEGATIVE ng/mL
CANNABINOIDS UR QL SCN: NEGATIVE ng/mL
Cocaine (Metab) Scrn, Ur: NEGATIVE ng/mL
Creatinine(Crt), U: 90.3 mg/dL (ref 20.0–300.0)
Methadone Screen, Urine: NEGATIVE ng/mL
OXYCODONE+OXYMORPHONE UR QL SCN: NEGATIVE ng/mL
Opiate Scrn, Ur: NEGATIVE ng/mL
Ph of Urine: 7.8 (ref 4.5–8.9)
Phencyclidine Qn, Ur: NEGATIVE ng/mL
Propoxyphene Scrn, Ur: NEGATIVE ng/mL

## 2020-02-03 LAB — URINE CULTURE: Organism ID, Bacteria: NO GROWTH

## 2020-02-04 ENCOUNTER — Encounter: Payer: Self-pay | Admitting: Advanced Practice Midwife

## 2020-02-04 DIAGNOSIS — R87612 Low grade squamous intraepithelial lesion on cytologic smear of cervix (LGSIL): Secondary | ICD-10-CM | POA: Insufficient documentation

## 2020-02-04 LAB — CYTOLOGY - PAP
Chlamydia: NEGATIVE
Comment: NEGATIVE
Comment: NORMAL
Neisseria Gonorrhea: NEGATIVE

## 2020-02-07 LAB — HGB FRACTIONATION CASCADE
Hgb A2: 2.9 % (ref 1.8–3.2)
Hgb A: 97.1 % (ref 96.4–98.8)
Hgb F: 0 % (ref 0.0–2.0)
Hgb S: 0 %

## 2020-02-07 LAB — OBSTETRIC PANEL, INCLUDING HIV
Antibody Screen: NEGATIVE
Basophils Absolute: 0 10*3/uL (ref 0.0–0.2)
Basos: 0 %
EOS (ABSOLUTE): 0.1 10*3/uL (ref 0.0–0.4)
Eos: 1 %
HIV Screen 4th Generation wRfx: NONREACTIVE
Hematocrit: 38.4 % (ref 34.0–46.6)
Hemoglobin: 13 g/dL (ref 11.1–15.9)
Hepatitis B Surface Ag: NEGATIVE
Immature Grans (Abs): 0 10*3/uL (ref 0.0–0.1)
Immature Granulocytes: 0 %
Lymphocytes Absolute: 2 10*3/uL (ref 0.7–3.1)
Lymphs: 17 %
MCH: 29.1 pg (ref 26.6–33.0)
MCHC: 33.9 g/dL (ref 31.5–35.7)
MCV: 86 fL (ref 79–97)
Monocytes Absolute: 0.5 10*3/uL (ref 0.1–0.9)
Monocytes: 5 %
Neutrophils Absolute: 9 10*3/uL — ABNORMAL HIGH (ref 1.4–7.0)
Neutrophils: 77 %
Platelets: 271 10*3/uL (ref 150–450)
RBC: 4.47 x10E6/uL (ref 3.77–5.28)
RDW: 13.6 % (ref 11.7–15.4)
RPR Ser Ql: NONREACTIVE
Rh Factor: POSITIVE
Rubella Antibodies, IGG: 1.64 index (ref >0.99)
WBC: 11.6 10*3/uL — ABNORMAL HIGH (ref 3.4–10.8)

## 2020-02-07 LAB — INTEGRATED 1
Crown Rump Length: 74.8 mm
Gest. Age on Collection Date: 13.3 weeks
Maternal Age at EDD: 24.2 yr
Nuchal Translucency (NT): 2.2 mm
Number of Fetuses: 1
PAPP-A Value: 1757.2 ng/mL
Weight: 171 [lb_av]

## 2020-02-07 LAB — MATERNIT 21 PLUS CORE, BLOOD
Fetal Fraction: 7
Result (T21): NEGATIVE
Trisomy 13 (Patau syndrome): NEGATIVE
Trisomy 18 (Edwards syndrome): NEGATIVE
Trisomy 21 (Down syndrome): NEGATIVE

## 2020-02-07 LAB — HEPATITIS C ANTIBODY: Hep C Virus Ab: <0.1 s/co ratio (ref 0.0–0.9)

## 2020-02-22 ENCOUNTER — Telehealth (INDEPENDENT_AMBULATORY_CARE_PROVIDER_SITE_OTHER): Payer: 59 | Admitting: Women's Health

## 2020-02-22 ENCOUNTER — Other Ambulatory Visit: Payer: Self-pay

## 2020-02-22 ENCOUNTER — Encounter: Payer: Self-pay | Admitting: Women's Health

## 2020-02-22 VITALS — BP 105/71 | HR 103 | Wt 175.0 lb

## 2020-02-22 DIAGNOSIS — Z363 Encounter for antenatal screening for malformations: Secondary | ICD-10-CM

## 2020-02-22 DIAGNOSIS — Z3A15 15 weeks gestation of pregnancy: Secondary | ICD-10-CM

## 2020-02-22 DIAGNOSIS — Z3402 Encounter for supervision of normal first pregnancy, second trimester: Secondary | ICD-10-CM

## 2020-02-22 MED ORDER — DOXYLAMINE-PYRIDOXINE 10-10 MG PO TBEC: DELAYED_RELEASE_TABLET | ORAL | 6 refills | Status: DC

## 2020-02-22 NOTE — Progress Notes (Signed)
TELEHEALTH VIRTUAL OBSTETRICS VISIT ENCOUNTER NOTE Patient name: Carolyn Ellis MRN [id number]  Date of birth: 10/19/1996  I connected with patient on 02/22/20 at  3:30 PM EDT by MyChart video  and verified that I am speaking with the correct person using two identifiers. Due to COVID-19 recommendations, pt is not currently in our office.    I discussed the limitations, risks, security and privacy concerns of performing an evaluation and management service by telephone and the availability of in person appointments. I also discussed with the patient that there Belsky be a patient responsible charge related to this service. The patient expressed understanding and agreed to proceed.  Chief Complaint:   Routine Prenatal Visit (need more zorfan only given 9 tabs)  History of Present Illness:   Carolyn Ellis is a 24 y.o. G1P0 female at [redacted]w[redacted]d with an Estimated Date of Delivery: 08/11/20 being evaluated today for ongoing management of a low-risk pregnancy.  Depression screen [facility] 2/9 02/01/2020 12/09/2019  Decreased Interest 1 0  Down, Depressed, Hopeless 2 0  PHQ - 2 Score 3 0  Altered sleeping 2 -  Tired, decreased energy 3 -  Change in appetite 3 -  Feeling bad or failure about yourself  1 -  Trouble concentrating 0 -  Moving slowly or fidgety/restless 2 -  Suicidal thoughts 0 -  PHQ-9 Score 14 -  Difficult doing work/chores Somewhat difficult -    Today she reports some nausea-zofran helps, pharmacy only gave her 9 out of 30 rx'd, likely all insurance would cover. PHQ 14 last visit, denies dep/anx- states situational d/t FOB leaving her. Contractions: Not present.  .   . denies leaking of fluid. Review of Systems:   Pertinent items are noted in HPI Denies abnormal vaginal discharge w/ itching/odor/irritation, headaches, visual changes, shortness of breath, chest pain, abdominal pain, severe nausea/vomiting, or problems with urination or bowel movements unless otherwise stated above. Pertinent  History Reviewed:  Reviewed past medical,surgical, social, obstetrical and family history.  Reviewed problem list, medications and allergies. Physical Assessment:   Vitals:   02/22/20 1459  BP: 105/71  Pulse: (!) 103  Weight: 175 lb (79.4 kg)  Body mass index is 29.12 kg/m.        Physical Examination:   General:  Alert, oriented and cooperative.   Mental Status: Normal mood and affect perceived. Normal judgment and thought content.  Rest of physical exam deferred due to type of encounter  No results found for this or any previous visit (from the past 24 hour(s)).  Assessment & Plan:  1) Pregnancy G1P0 at [redacted]w[redacted]d with an Estimated Date of Delivery: 08/11/20   2) Nausea, rx diclegis, will see if covered better by her insurance   Meds:  Meds ordered this encounter  Medications  . Doxylamine-Pyridoxine (DICLEGIS) 10-10 MG TBEC    Sig: 2 tabs q hs, if sx persist add 1 tab q am on day 3, if sx persist add 1 tab q afternoon on day 4    Dispense:  100 tablet    Refill:  6    Order Specific Question:   Supervising Provider    Answer:   Lazaro Arms [[id number]]    Labs/procedures today: none  Plan:  Continue routine obstetrical care.  Has home bp cuff.  Check bp weekly, let us know if >140/90.  Next visit: prefers will be in person for anatomy u/s, 2nd IT    Reviewed: Preterm labor symptoms and general obstetric precautions including  but not limited to vaginal bleeding, contractions, leaking of fluid and fetal movement were reviewed in detail with the patient. The patient was advised to call back or seek an in-person office evaluation/go to [facility] at [facility] for any urgent or concerning symptoms. All questions were answered. Please refer to After Visit Summary for other counseling recommendations.    I provided 15 minutes of non-face-to-face time during this encounter.  Follow-up: Return in about 3 weeks (around 03/14/2020) for LROB, 2nd IT, XE:NMMHWKG, in person,  CNM.  Orders Placed This Encounter  Procedures  . US OB Comp + [place] 02/22/2020 3:20 PM

## 2020-02-22 NOTE — Patient Instructions (Signed)
Carolyn Ellis, I greatly value your feedback.  If you receive a survey following your visit with [place] today, we appreciate you taking the time to fill it out.  Thanks, Joellyn Haff, CNM, WHNP-BC  [facility] ([address] [place], [place] [address]) Entrance C, located off of [address] valet parking  Go to [email or web address] to register for FREE online childbirth classes  [place] Pediatricians/Family Doctors:  [facility] [phone]            [facility] [phone]                 [facility] [phone] (usually not accepting new patients unless you have family there already, you are always welcome to call and ask)       [facility] Department [phone]       [facility] Pediatricians/Family Doctors:   [facility]: [phone]  [facility]: [phone]  [facility]: [phone]  [facility] Doctors:   [facility]: [phone]   [facility]: [phone]  [facility] Doctors:  [facility]: [phone]    Home Blood Pressure Monitoring for Patients   Your provider has recommended that you check your blood pressure (BP) at least once a week at home. If you do not have a blood pressure cuff at home, one will be provided for you. Contact your provider if you have not received your monitor within 1 week.   Helpful Tips for Accurate Home Blood Pressure Checks  . Don't smoke, exercise, or drink caffeine 30 minutes before checking your BP . Use the restroom before checking your BP (a full bladder can raise your pressure) . Relax in a comfortable upright chair . Feet on the ground . Left arm resting comfortably on a flat surface at the level of your heart . Legs uncrossed . Back supported . Sit quietly and don't talk . Place the cuff on your bare arm . Adjust snuggly, so that  only two fingertips can fit between your skin and the top of the cuff . Check 2 readings separated by at least one minute . Keep a log of your BP readings . For a visual, please reference this diagram: http://ccnc.care/bpdiagram  Provider Name: [facility]     Phone: [phone]  Zone 1: ALL CLEAR  Continue to monitor your symptoms:  . BP reading is less than 140 (top number) or less than 90 (bottom number)  . No right upper stomach pain . No headaches or seeing spots . No feeling nauseated or throwing up . No swelling in face and hands  Zone 2: CAUTION Call your doctor's office for any of the following:  . BP reading is greater than 140 (top number) or greater than 90 (bottom number)  . Stomach pain under your ribs in the middle or right side . Headaches or seeing spots . Feeling nauseated or throwing up . Swelling in face and hands  Zone 3: EMERGENCY  Seek immediate medical care if you have any of the following:  . BP reading is greater than160 (top number) or greater than 110 (bottom number) . Severe headaches not improving with Tylenol . Serious difficulty catching your breath . Any worsening symptoms from Zone 2     Second Trimester of Pregnancy The second trimester is from week 14 through week 27 (months 4 through 6). The second trimester is often a time when you feel your  best. Your body has adjusted to being pregnant, and you begin to feel better physically. Usually, morning sickness has lessened or quit completely, you Koppelman have more energy, and you Helfrich have an increase in appetite. The second trimester is also a time when the fetus is growing rapidly. At the end of the sixth month, the fetus is about 9 inches long and weighs about 1 pounds. You will likely begin to feel the baby move (quickening) between 16 and 20 weeks of pregnancy. Body changes during your second trimester Your body continues to go through many changes during your second trimester. The changes  vary from woman to woman.  Your weight will continue to increase. You will notice your lower abdomen bulging out.  You Rezabek begin to get stretch marks on your hips, abdomen, and breasts.  You Swantek develop headaches that can be relieved by medicines. The medicines should be approved by your health care provider.  You Candelas urinate more often because the fetus is pressing on your bladder.  You Maryland develop or continue to have heartburn as a result of your pregnancy.  You Barnfield develop constipation because certain hormones are causing the muscles that push waste through your intestines to slow down.  You Stiger develop hemorrhoids or swollen, bulging veins (varicose veins).  You Pallett have back pain. This is caused by: ? Weight gain. ? Pregnancy hormones that are relaxing the joints in your pelvis. ? A shift in weight and the muscles that support your balance.  Your breasts will continue to grow and they will continue to become tender.  Your gums Haberl bleed and Jasper be sensitive to brushing and flossing.  Dark spots or blotches (chloasma, mask of pregnancy) Weaver develop on your face. This will likely fade after the baby is born.  A dark line from your belly button to the pubic area (linea nigra) Emme appear. This will likely fade after the baby is born.  You Zbikowski have changes in your hair. These can include thickening of your hair, rapid growth, and changes in texture. Some women also have hair loss during or after pregnancy, or hair that feels dry or thin. Your hair will most likely return to normal after your baby is born.  What to expect at prenatal visits During a routine prenatal visit:  You will be weighed to make sure you and the fetus are growing normally.  Your blood pressure will be taken.  Your abdomen will be measured to track your baby's growth.  The fetal heartbeat will be listened to.  Any test results from the previous visit will be discussed.  Your health care provider Pierotti  ask you:  How you are feeling.  If you are feeling the baby move.  If you have had any abnormal symptoms, such as leaking fluid, bleeding, severe headaches, or abdominal cramping.  If you are using any tobacco products, including cigarettes, chewing tobacco, and electronic cigarettes.  If you have any questions.  Other tests that Langstaff be performed during your second trimester include:  Blood tests that check for: ? Low iron levels (anemia). ? High blood sugar that affects pregnant women (gestational diabetes) between 79 and 28 weeks. ? Rh antibodies. This is to check for a protein on red blood cells (Rh factor).  Urine tests to check for infections, diabetes, or protein in the urine.  An ultrasound to confirm the proper growth and development of the baby.  An amniocentesis to check for possible genetic problems.  Fetal  screens for spina bifida and Down syndrome.  HIV (human immunodeficiency virus) testing. Routine prenatal testing includes screening for HIV, unless you choose not to have this test.  Follow these instructions at home: Medicines  Follow your health care provider's instructions regarding medicine use. Specific medicines Bredeson be either safe or unsafe to take during pregnancy.  Take a prenatal vitamin that contains at least 600 micrograms (mcg) of folic acid.  If you develop constipation, try taking a stool softener if your health care provider approves. Eating and drinking  Eat a balanced diet that includes fresh fruits and vegetables, whole grains, good sources of protein such as meat, eggs, or tofu, and low-fat dairy. Your health care provider will help you determine the amount of weight gain that is right for you.  Avoid raw meat and uncooked cheese. These carry germs that can cause birth defects in the baby.  If you have low calcium intake from food, talk to your health care provider about whether you should take a daily calcium supplement.  Limit foods  that are high in fat and processed sugars, such as fried and sweet foods.  To prevent constipation: ? Drink enough fluid to keep your urine clear or pale yellow. ? Eat foods that are high in fiber, such as fresh fruits and vegetables, whole grains, and beans. Activity  Exercise only as directed by your health care provider. Most women can continue their usual exercise routine during pregnancy. Try to exercise for 30 minutes at least 5 days a week. Stop exercising if you experience uterine contractions.  Avoid heavy lifting, wear low heel shoes, and practice good posture.  A sexual relationship Semple be continued unless your health care provider directs you otherwise. Relieving pain and discomfort  Wear a good support bra to prevent discomfort from breast tenderness.  Take warm sitz baths to soothe any pain or discomfort caused by hemorrhoids. Use hemorrhoid cream if your health care provider approves.  Rest with your legs elevated if you have leg cramps or low back pain.  If you develop varicose veins, wear support hose. Elevate your feet for 15 minutes, 3-4 times a day. Limit salt in your diet. Prenatal Care  Write down your questions. Take them to your prenatal visits.  Keep all your prenatal visits as told by your health care provider. This is important. Safety  Wear your seat belt at all times when driving.  Make a list of emergency phone numbers, including numbers for family, friends, the hospital, and police and fire departments. General instructions  Ask your health care provider for a referral to a local prenatal education class. Begin classes no later than the beginning of month 6 of your pregnancy.  Ask for help if you have counseling or nutritional needs during pregnancy. Your health care provider can offer advice or refer you to specialists for help with various needs.  Do not use hot tubs, steam rooms, or saunas.  Do not douche or use tampons or scented sanitary  pads.  Do not cross your legs for long periods of time.  Avoid cat litter boxes and soil used by cats. These carry germs that can cause birth defects in the baby and possibly loss of the fetus by miscarriage or stillbirth.  Avoid all smoking, herbs, alcohol, and unprescribed drugs. Chemicals in these products can affect the formation and growth of the baby.  Do not use any products that contain nicotine or tobacco, such as cigarettes and e-cigarettes. If you need help  quitting, ask your health care provider.  Visit your dentist if you have not gone yet during your pregnancy. Use a soft toothbrush to brush your teeth and be gentle when you floss. Contact a health care provider if:  You have dizziness.  You have mild pelvic cramps, pelvic pressure, or nagging pain in the abdominal area.  You have persistent nausea, vomiting, or diarrhea.  You have a bad smelling vaginal discharge.  You have pain when you urinate. Get help right away if:  You have a fever.  You are leaking fluid from your vagina.  You have spotting or bleeding from your vagina.  You have severe abdominal cramping or pain.  You have rapid weight gain or weight loss.  You have shortness of breath with chest pain.  You notice sudden or extreme swelling of your face, hands, ankles, feet, or legs.  You have not felt your baby move in over an hour.  You have severe headaches that do not go away when you take medicine.  You have vision changes. Summary  The second trimester is from week 14 through week 27 (months 4 through 6). It is also a time when the fetus is growing rapidly.  Your body goes through many changes during pregnancy. The changes vary from woman to woman.  Avoid all smoking, herbs, alcohol, and unprescribed drugs. These chemicals affect the formation and growth your baby.  Do not use any tobacco products, such as cigarettes, chewing tobacco, and e-cigarettes. If you need help quitting, ask your  health care provider.  Contact your health care provider if you have any questions. Keep all prenatal visits as told by your health care provider. This is important. This information is not intended to replace advice given to you by your health care provider. Make sure you discuss any questions you have with your health care provider. Document Released: 10/09/2001 Document Revised: 03/22/2016 Document Reviewed: 12/16/2012 Elsevier Interactive Patient Education  2017 Elsevier Inc.  PROTECT YOURSELF & YOUR BABY FROM THE FLU! Because you are pregnant, we at [facility], along with the Centers for Disease Control (CDC), recommend that you receive the flu vaccine to protect yourself and your baby from the flu. The flu is more likely to cause severe illness in pregnant women than in women of reproductive age who are not pregnant. Changes in the immune system, heart, and lungs during pregnancy make pregnant women (and women up to two weeks postpartum) more prone to severe illness from flu, including illness resulting in hospitalization. Flu also Kolden be harmful for a pregnant woman's developing baby. A common flu symptom is fever, which Cullop be associated with neural tube defects and other adverse outcomes for a developing baby. Getting vaccinated can also help protect a baby after birth from flu. (Mom passes antibodies onto the developing baby during her pregnancy.)  A Flu Vaccine is the Best Protection Against Flu Getting a flu vaccine is the first and most important step in protecting against flu. Pregnant women should get a flu shot and not the live attenuated influenza vaccine (LAIV), also known as nasal spray flu vaccine. Flu vaccines given during pregnancy help protect both the mother and her baby from flu. Vaccination has been shown to reduce the risk of flu-associated acute respiratory infection in pregnant women by up to one-half. A 2018 study showed that getting a flu shot reduced a pregnant woman's  risk of being hospitalized with flu by an average of 40 percent. Pregnant women who get a  flu vaccine are also helping to protect their babies from flu illness for the first several months after their birth, when they are too young to get vaccinated.   A Long Record of Safety for Flu Shots in Pregnant Women Flu shots have been given to millions of pregnant women over many years with a good safety record. There is a lot of evidence that flu vaccines can be given safely during pregnancy; though these data are limited for the first trimester. The CDC recommends that pregnant women get vaccinated during any trimester of their pregnancy. It is very important for pregnant women to get the flu shot.   Other Preventive Actions In addition to getting a flu shot, pregnant women should take the same everyday preventive actions the CDC recommends of everyone, including covering coughs, washing hands often, and avoiding people who are sick.  Symptoms and Treatment If you get sick with flu symptoms call your doctor right away. There are antiviral drugs that can treat flu illness and prevent serious flu complications. The CDC recommends prompt treatment for people who have influenza infection or suspected influenza infection and who are at high risk of serious flu complications, such as people with asthma, diabetes (including gestational diabetes), or heart disease. Early treatment of influenza in hospitalized pregnant women has been shown to reduce the length of the hospital stay.  Symptoms Flu symptoms include fever, cough, sore throat, runny or stuffy nose, body aches, headache, chills and fatigue. Some people Maragh also have vomiting and diarrhea. People Copes be infected with the flu and have respiratory symptoms without a fever.  Early Treatment is Important for Pregnant Women Treatment should begin as soon as possible because antiviral drugs work best when started early (within 48 hours after symptoms  start). Antiviral drugs can make your flu illness milder and make you feel better faster. They Kaneshiro also prevent serious health problems that can result from flu illness. Oral oseltamivir (Tamiflu) is the preferred treatment for pregnant women because it has the most studies available to suggest that it is safe and beneficial. Antiviral drugs require a prescription from your provider. Having a fever caused by flu infection or other infections early in pregnancy Bua be linked to birth defects in a baby. In addition to taking antiviral drugs, pregnant women who get a fever should treat their fever with Tylenol (acetaminophen) and contact their provider immediately.  When to [place] If you are pregnant and have any of these signs, seek care immediately:  Difficulty breathing or shortness of breath  Pain or pressure in the chest or abdomen  Sudden dizziness  Confusion  Severe or persistent vomiting  High fever that is not responding to Tylenol (or store brand equivalent)  Decreased or no movement of your baby  SolutionApps.[email or web address]

## 2020-03-04 ENCOUNTER — Ambulatory Visit: Payer: 59

## 2020-03-08 ENCOUNTER — Telehealth: Payer: Self-pay | Admitting: Women's Health

## 2020-03-08 NOTE — Telephone Encounter (Signed)
Has some questions about a medication

## 2020-03-08 NOTE — Telephone Encounter (Signed)
Telephoned patient at home number. Patient states is having diarrhea at time and was afraid that is was nausea medication causing this. Patient states been taking medication for 2 weeks and no problems until now. Advised patient could stop medication and just try Zofran but if problem persisted could discuss with Dr at next visit. Patient voiced understanding.

## 2020-03-15 ENCOUNTER — Ambulatory Visit (INDEPENDENT_AMBULATORY_CARE_PROVIDER_SITE_OTHER): Payer: 59 | Admitting: Women's Health

## 2020-03-15 ENCOUNTER — Ambulatory Visit (INDEPENDENT_AMBULATORY_CARE_PROVIDER_SITE_OTHER): Payer: 59

## 2020-03-15 ENCOUNTER — Encounter: Payer: Self-pay | Admitting: Women's Health

## 2020-03-15 ENCOUNTER — Other Ambulatory Visit: Payer: Self-pay

## 2020-03-15 VITALS — BP 128/78 | HR 99 | Wt 184.6 lb

## 2020-03-15 DIAGNOSIS — Z3A18 18 weeks gestation of pregnancy: Secondary | ICD-10-CM | POA: Diagnosis not present

## 2020-03-15 DIAGNOSIS — R87612 Low grade squamous intraepithelial lesion on cytologic smear of cervix (LGSIL): Secondary | ICD-10-CM

## 2020-03-15 DIAGNOSIS — Z3402 Encounter for supervision of normal first pregnancy, second trimester: Secondary | ICD-10-CM

## 2020-03-15 DIAGNOSIS — Z363 Encounter for antenatal screening for malformations: Secondary | ICD-10-CM | POA: Diagnosis not present

## 2020-03-15 DIAGNOSIS — Z331 Pregnant state, incidental: Secondary | ICD-10-CM

## 2020-03-15 DIAGNOSIS — Z1379 Encounter for other screening for genetic and chromosomal anomalies: Secondary | ICD-10-CM

## 2020-03-15 DIAGNOSIS — Z1389 Encounter for screening for other disorder: Secondary | ICD-10-CM

## 2020-03-15 LAB — POCT URINALYSIS DIPSTICK OB
Blood, UA: NEGATIVE
Glucose, UA: NEGATIVE
Ketones, UA: NEGATIVE
Leukocytes, UA: NEGATIVE
Nitrite, UA: NEGATIVE
POC,PROTEIN,UA: NEGATIVE

## 2020-03-15 NOTE — Patient Instructions (Signed)
Carolyn Ellis, I greatly value your feedback.  If you receive a survey following your visit with us today, we appreciate you taking the time to fill it out.  Thanks, Kim Zorawar Strollo, CNM, WHNP-BC  [facility] ([address] [place], [place] [address]) Entrance C, located off of [address] Free 24/7 valet parking  Go to [email or web address] to register for FREE online childbirth classes  [place] Pediatricians/Family Doctors:  [facility] [phone]            [facility] [phone]                 [facility] [phone] (usually not accepting new patients unless you have family there already, you are always welcome to call and ask)       [facility] [phone]       [place] Pediatricians/Family Doctors:   [facility]: [phone]  [facility]: [phone]  [facility]: [phone]  [place] Family Doctors:   [facility]: [phone]   [facility]: [phone]  [place] Family Doctors:  [facility]: [phone]    Home Blood Pressure Monitoring for Patients   Your provider has recommended that you check your blood pressure (BP) at least once a week at home. If you do not have a blood pressure cuff at home, one will be provided for you. Contact your provider if you have not received your monitor within 1 week.   Helpful Tips for Accurate Home Blood Pressure Checks  . Don't smoke, exercise, or drink caffeine 30 minutes before checking your BP . Use the restroom before checking your BP (a full bladder can raise your pressure) . Relax in a comfortable upright chair . Feet on the ground . Left arm resting comfortably on a flat surface at the level of your heart . Legs uncrossed . Back supported . Sit quietly and don't talk . Place the cuff on your bare arm . Adjust snuggly, so that  only two fingertips can fit between your skin and the top of the cuff . Check 2 readings separated by at least one minute . Keep a log of your BP readings . For a visual, please reference this diagram: http://ccnc.care/bpdiagram  Provider Name: [facility]     Phone: [phone]  Zone 1: ALL CLEAR  Continue to monitor your symptoms:  . BP reading is less than 140 (top number) or less than 90 (bottom number)  . No right upper stomach pain . No headaches or seeing spots . No feeling nauseated or throwing up . No swelling in face and hands  Zone 2: CAUTION Call your doctor's office for any of the following:  . BP reading is greater than 140 (top number) or greater than 90 (bottom number)  . Stomach pain under your ribs in the middle or right side . Headaches or seeing spots . Feeling nauseated or throwing up . Swelling in face and hands  Zone 3: EMERGENCY  Seek immediate medical care if you have any of the following:  . BP reading is greater than160 (top number) or greater than 110 (bottom number) . Severe headaches not improving with Tylenol . Serious difficulty catching your breath . Any worsening symptoms from Zone 2     Second Trimester of Pregnancy The second trimester is from week 14 through week 27 (months 4 through 6). The second trimester is often a time when you feel your   best. Your body has adjusted to being pregnant, and you begin to feel better physically. Usually, morning sickness has lessened or quit completely, you Villagomez have more energy, and you Storti have an increase in appetite. The second trimester is also a time when the fetus is growing rapidly. At the end of the sixth month, the fetus is about 9 inches long and weighs about 1 pounds. You will likely begin to feel the baby move (quickening) between 16 and 20 weeks of pregnancy. Body changes during your second trimester Your body continues to go through many changes during your second trimester. The changes  vary from woman to woman.  Your weight will continue to increase. You will notice your lower abdomen bulging out.  You Brecheen begin to get stretch marks on your hips, abdomen, and breasts.  You Crist develop headaches that can be relieved by medicines. The medicines should be approved by your health care provider.  You Breunig urinate more often because the fetus is pressing on your bladder.  You Romaniello develop or continue to have heartburn as a result of your pregnancy.  You Thane develop constipation because certain hormones are causing the muscles that push waste through your intestines to slow down.  You Noteboom develop hemorrhoids or swollen, bulging veins (varicose veins).  You Scheiderer have back pain. This is caused by: ? Weight gain. ? Pregnancy hormones that are relaxing the joints in your pelvis. ? A shift in weight and the muscles that support your balance.  Your breasts will continue to grow and they will continue to become tender.  Your gums Chien bleed and Abts be sensitive to brushing and flossing.  Dark spots or blotches (chloasma, mask of pregnancy) Shreffler develop on your face. This will likely fade after the baby is born.  A dark line from your belly button to the pubic area (linea nigra) Gorum appear. This will likely fade after the baby is born.  You Santillo have changes in your hair. These can include thickening of your hair, rapid growth, and changes in texture. Some women also have hair loss during or after pregnancy, or hair that feels dry or thin. Your hair will most likely return to normal after your baby is born.  What to expect at prenatal visits During a routine prenatal visit:  You will be weighed to make sure you and the fetus are growing normally.  Your blood pressure will be taken.  Your abdomen will be measured to track your baby's growth.  The fetal heartbeat will be listened to.  Any test results from the previous visit will be discussed.  Your health care provider Selinger  ask you:  How you are feeling.  If you are feeling the baby move.  If you have had any abnormal symptoms, such as leaking fluid, bleeding, severe headaches, or abdominal cramping.  If you are using any tobacco products, including cigarettes, chewing tobacco, and electronic cigarettes.  If you have any questions.  Other tests that Macias be performed during your second trimester include:  Blood tests that check for: ? Low iron levels (anemia). ? High blood sugar that affects pregnant women (gestational diabetes) between 79 and 28 weeks. ? Rh antibodies. This is to check for a protein on red blood cells (Rh factor).  Urine tests to check for infections, diabetes, or protein in the urine.  An ultrasound to confirm the proper growth and development of the baby.  An amniocentesis to check for possible genetic problems.  Fetal  screens for spina bifida and Down syndrome.  HIV (human immunodeficiency virus) testing. Routine prenatal testing includes screening for HIV, unless you choose not to have this test.  Follow these instructions at home: Medicines  Follow your health care provider's instructions regarding medicine use. Specific medicines Lanza be either safe or unsafe to take during pregnancy.  Take a prenatal vitamin that contains at least 600 micrograms (mcg) of folic acid.  If you develop constipation, try taking a stool softener if your health care provider approves. Eating and drinking  Eat a balanced diet that includes fresh fruits and vegetables, whole grains, good sources of protein such as meat, eggs, or tofu, and low-fat dairy. Your health care provider will help you determine the amount of weight gain that is right for you.  Avoid raw meat and uncooked cheese. These carry germs that can cause birth defects in the baby.  If you have low calcium intake from food, talk to your health care provider about whether you should take a daily calcium supplement.  Limit foods  that are high in fat and processed sugars, such as fried and sweet foods.  To prevent constipation: ? Drink enough fluid to keep your urine clear or pale yellow. ? Eat foods that are high in fiber, such as fresh fruits and vegetables, whole grains, and beans. Activity  Exercise only as directed by your health care provider. Most women can continue their usual exercise routine during pregnancy. Try to exercise for 30 minutes at least 5 days a week. Stop exercising if you experience uterine contractions.  Avoid heavy lifting, wear low heel shoes, and practice good posture.  A sexual relationship Carda be continued unless your health care provider directs you otherwise. Relieving pain and discomfort  Wear a good support bra to prevent discomfort from breast tenderness.  Take warm sitz baths to soothe any pain or discomfort caused by hemorrhoids. Use hemorrhoid cream if your health care provider approves.  Rest with your legs elevated if you have leg cramps or low back pain.  If you develop varicose veins, wear support hose. Elevate your feet for 15 minutes, 3-4 times a day. Limit salt in your diet. Prenatal Care  Write down your questions. Take them to your prenatal visits.  Keep all your prenatal visits as told by your health care provider. This is important. Safety  Wear your seat belt at all times when driving.  Make a list of emergency phone numbers, including numbers for family, friends, the hospital, and police and fire departments. General instructions  Ask your health care provider for a referral to a local prenatal education class. Begin classes no later than the beginning of month 6 of your pregnancy.  Ask for help if you have counseling or nutritional needs during pregnancy. Your health care provider can offer advice or refer you to specialists for help with various needs.  Do not use hot tubs, steam rooms, or saunas.  Do not douche or use tampons or scented sanitary  pads.  Do not cross your legs for long periods of time.  Avoid cat litter boxes and soil used by cats. These carry germs that can cause birth defects in the baby and possibly loss of the fetus by miscarriage or stillbirth.  Avoid all smoking, herbs, alcohol, and unprescribed drugs. Chemicals in these products can affect the formation and growth of the baby.  Do not use any products that contain nicotine or tobacco, such as cigarettes and e-cigarettes. If you need help  quitting, ask your health care provider.  Visit your dentist if you have not gone yet during your pregnancy. Use a soft toothbrush to brush your teeth and be gentle when you floss. Contact a health care provider if:  You have dizziness.  You have mild pelvic cramps, pelvic pressure, or nagging pain in the abdominal area.  You have persistent nausea, vomiting, or diarrhea.  You have a bad smelling vaginal discharge.  You have pain when you urinate. Get help right away if:  You have a fever.  You are leaking fluid from your vagina.  You have spotting or bleeding from your vagina.  You have severe abdominal cramping or pain.  You have rapid weight gain or weight loss.  You have shortness of breath with chest pain.  You notice sudden or extreme swelling of your face, hands, ankles, feet, or legs.  You have not felt your baby move in over an hour.  You have severe headaches that do not go away when you take medicine.  You have vision changes. Summary  The second trimester is from week 14 through week 27 (months 4 through 6). It is also a time when the fetus is growing rapidly.  Your body goes through many changes during pregnancy. The changes vary from woman to woman.  Avoid all smoking, herbs, alcohol, and unprescribed drugs. These chemicals affect the formation and growth your baby.  Do not use any tobacco products, such as cigarettes, chewing tobacco, and e-cigarettes. If you need help quitting, ask your  health care provider.  Contact your health care provider if you have any questions. Keep all prenatal visits as told by your health care provider. This is important. This information is not intended to replace advice given to you by your health care provider. Make sure you discuss any questions you have with your health care provider. Document Released: 10/09/2001 Document Revised: 03/22/2016 Document Reviewed: 12/16/2012 Elsevier Interactive Patient Education  2017 Elsevier Inc.  PROTECT YOURSELF & YOUR BABY FROM THE FLU! Because you are pregnant, we at [facility], along with the Centers for Disease Control (CDC), recommend that you receive the flu vaccine to protect yourself and your baby from the flu. The flu is more likely to cause severe illness in pregnant women than in women of reproductive age who are not pregnant. Changes in the immune system, heart, and lungs during pregnancy make pregnant women (and women up to two weeks postpartum) more prone to severe illness from flu, including illness resulting in hospitalization. Flu also Tuminello be harmful for a pregnant woman's developing baby. A common flu symptom is fever, which Nielson be associated with neural tube defects and other adverse outcomes for a developing baby. Getting vaccinated can also help protect a baby after birth from flu. (Mom passes antibodies onto the developing baby during her pregnancy.)  A Flu Vaccine is the Best Protection Against Flu Getting a flu vaccine is the first and most important step in protecting against flu. Pregnant women should get a flu shot and not the live attenuated influenza vaccine (LAIV), also known as nasal spray flu vaccine. Flu vaccines given during pregnancy help protect both the mother and her baby from flu. Vaccination has been shown to reduce the risk of flu-associated acute respiratory infection in pregnant women by up to one-half. A 2018 study showed that getting a flu shot reduced a pregnant woman's  risk of being hospitalized with flu by an average of 40 percent. Pregnant women who get a  flu vaccine are also helping to protect their babies from flu illness for the first several months after their birth, when they are too young to get vaccinated.   A Long Record of Safety for Flu Shots in Pregnant Women Flu shots have been given to millions of pregnant women over many years with a good safety record. There is a lot of evidence that flu vaccines can be given safely during pregnancy; though these data are limited for the first trimester. The CDC recommends that pregnant women get vaccinated during any trimester of their pregnancy. It is very important for pregnant women to get the flu shot.   Other Preventive Actions In addition to getting a flu shot, pregnant women should take the same everyday preventive actions the CDC recommends of everyone, including covering coughs, washing hands often, and avoiding people who are sick.  Symptoms and Treatment If you get sick with flu symptoms call your doctor right away. There are antiviral drugs that can treat flu illness and prevent serious flu complications. The CDC recommends prompt treatment for people who have influenza infection or suspected influenza infection and who are at high risk of serious flu complications, such as people with asthma, diabetes (including gestational diabetes), or heart disease. Early treatment of influenza in hospitalized pregnant women has been shown to reduce the length of the hospital stay.  Symptoms Flu symptoms include fever, cough, sore throat, runny or stuffy nose, body aches, headache, chills and fatigue. Some people Maragh also have vomiting and diarrhea. People Copes be infected with the flu and have respiratory symptoms without a fever.  Early Treatment is Important for Pregnant Women Treatment should begin as soon as possible because antiviral drugs work best when started early (within 48 hours after symptoms  start). Antiviral drugs can make your flu illness milder and make you feel better faster. They Kaneshiro also prevent serious health problems that can result from flu illness. Oral oseltamivir (Tamiflu) is the preferred treatment for pregnant women because it has the most studies available to suggest that it is safe and beneficial. Antiviral drugs require a prescription from your provider. Having a fever caused by flu infection or other infections early in pregnancy Bua be linked to birth defects in a baby. In addition to taking antiviral drugs, pregnant women who get a fever should treat their fever with Tylenol (acetaminophen) and contact their provider immediately.  When to [place] If you are pregnant and have any of these signs, seek care immediately:  Difficulty breathing or shortness of breath  Pain or pressure in the chest or abdomen  Sudden dizziness  Confusion  Severe or persistent vomiting  High fever that is not responding to Tylenol (or store brand equivalent)  Decreased or no movement of your baby  [email or web address]

## 2020-03-15 NOTE — Progress Notes (Signed)
[place] 18+6 wks,cephalic,cx 5.3 cm,anterior placenta gr 0,normal ovaries,svp of fluid 4.5 cm,LVEICF 1.8 mm,fhr 140 bpm,anatomy complete,efw 321 g 97%

## 2020-03-15 NOTE — Progress Notes (Signed)
LOW-RISK PREGNANCY VISIT Patient name: Carolyn Ellis MRN [id number]  Date of birth: [birth year] Chief Complaint:   Routine Prenatal Visit  History of Present Illness:   Carolyn Ellis is a 24 y.o. G1P0 female at [redacted]w[redacted]d with an Estimated Date of Delivery: 08/11/20 being seen today for ongoing management of a low-risk pregnancy.  Depression screen [facility] 2/9 02/01/2020 12/09/2019  Decreased Interest 1 0  Down, Depressed, Hopeless 2 0  PHQ - 2 Score 3 0  Altered sleeping 2 -  Tired, decreased energy 3 -  Change in appetite 3 -  Feeling bad or failure about yourself  1 -  Trouble concentrating 0 -  Moving slowly or fidgety/restless 2 -  Suicidal thoughts 0 -  PHQ-9 Score 14 -  Difficult doing work/chores Somewhat difficult -    Today she reports no complaints. Contractions: Not present. Vag. Bleeding: None.   . denies leaking of fluid. Review of Systems:   Pertinent items are noted in HPI Denies abnormal vaginal discharge w/ itching/odor/irritation, headaches, visual changes, shortness of breath, chest pain, abdominal pain, severe nausea/vomiting, or problems with urination or bowel movements unless otherwise stated above. Pertinent History Reviewed:  Reviewed past medical,surgical, social, obstetrical and family history.  Reviewed problem list, medications and allergies. Physical Assessment:   Vitals:   03/15/20 1428  BP: 128/78  Pulse: 99  Weight: 184 lb 9.6 oz (83.7 kg)  Body mass index is 30.72 kg/m.        Physical Examination:   General appearance: Well appearing, and in no distress  Mental status: Alert, oriented to person, place, and time  Skin: Warm & dry  Cardiovascular: Normal heart rate noted  Respiratory: Normal respiratory effort, no distress  Abdomen: Soft, gravid, nontender  Pelvic: Cervical exam deferred         Extremities: Edema: None  Fetal Status:         [place] 18+6 wks,cephalic,cx 5.3 cm,anterior placenta gr 0,normal ovaries,svp of fluid 4.5 cm,LVEICF 1.8 mm,fhr  140 bpm,anatomy complete,efw 321 g 97%  Chaperone: n/a    Results for orders placed or performed in visit on 03/15/20 (from the past 24 hour(s))  POC Urinalysis Dipstick OB   Collection Time: 03/15/20  2:27 PM  Result Value Ref Range   Color, UA     Clarity, UA     Glucose, UA Negative Negative   Bilirubin, UA     Ketones, UA n    Spec Grav, UA     Blood, UA n    pH, UA     POC,PROTEIN,UA Negative Negative, Trace, Small (1+), Moderate (2+), Large (3+), 4+   Urobilinogen, UA     Nitrite, UA n    Leukocytes, UA Negative Negative   Appearance     Odor      Assessment & Plan:  1) Low-risk pregnancy G1P0 at [redacted]w[redacted]d with an Estimated Date of Delivery: 08/11/20   2) Isolated fetal EICF, neg NIPS, discussed and gave printed info   Meds: No orders of the defined types were placed in this encounter.  Labs/procedures today: 2nd IT, anatomy u/s  Plan:  Continue routine obstetrical care  Next visit: prefers in person    Reviewed: Preterm labor symptoms and general obstetric precautions including but not limited to vaginal bleeding, contractions, leaking of fluid and fetal movement were reviewed in detail with the patient.  All questions were answered. Has home bp cuff. Check bp weekly, let us know if >140/90.   Follow-up: Return in  about 4 weeks (around 04/12/2020) for LROB, CNM, in person.  Orders Placed This Encounter  Procedures  . INTEGRATED 2  . POC Urinalysis Dipstick OB   Roma Schanz CNM, [facility] 03/15/2020 3:10 PM

## 2020-03-17 LAB — INTEGRATED 2
AFP MoM: 0.62
Alpha-Fetoprotein: 28.7 ng/mL
Crown Rump Length: 74.8 mm
DIA MoM: 1.02
DIA Value: 162.4 pg/mL
Estriol, Unconjugated: 2.39 ng/mL
Gest. Age on Collection Date: 13.3 weeks
Gestational Age: 19.4 weeks
Maternal Age at EDD: 24.2 yr
Nuchal Translucency (NT): 2.2 mm
Nuchal Translucency MoM: 1.28
Number of Fetuses: 1
PAPP-A MoM: 1.55
PAPP-A Value: 1757.2 ng/mL
Test Results:: NEGATIVE
Weight: 171 [lb_av]
Weight: 171 [lb_av]
hCG MoM: 0.94
hCG Value: 19.2 IU/mL
uE3 MoM: 1.28

## 2020-03-22 ENCOUNTER — Encounter: Payer: Self-pay | Admitting: *Deleted

## 2020-04-12 ENCOUNTER — Other Ambulatory Visit (HOSPITAL_COMMUNITY)
Admission: RE | Admit: 2020-04-12 | Discharge: 2020-04-12 | Disposition: A | Discharge status: Home / Self Care | Payer: 59 | Source: Ambulatory Visit | Attending: Obstetrics and Gynecology | Admitting: Obstetrics and Gynecology

## 2020-04-12 ENCOUNTER — Encounter: Payer: Self-pay | Admitting: Women's Health

## 2020-04-12 ENCOUNTER — Ambulatory Visit (INDEPENDENT_AMBULATORY_CARE_PROVIDER_SITE_OTHER): Payer: 59 | Admitting: Women's Health

## 2020-04-12 VITALS — BP 125/70 | HR 97 | Wt 197.0 lb

## 2020-04-12 DIAGNOSIS — N898 Other specified noninflammatory disorders of vagina: Secondary | ICD-10-CM

## 2020-04-12 DIAGNOSIS — O26892 Other specified pregnancy related conditions, second trimester: Secondary | ICD-10-CM | POA: Insufficient documentation

## 2020-04-12 DIAGNOSIS — Z3402 Encounter for supervision of normal first pregnancy, second trimester: Secondary | ICD-10-CM | POA: Insufficient documentation

## 2020-04-12 DIAGNOSIS — Z3A22 22 weeks gestation of pregnancy: Secondary | ICD-10-CM

## 2020-04-12 DIAGNOSIS — Z1389 Encounter for screening for other disorder: Secondary | ICD-10-CM

## 2020-04-12 DIAGNOSIS — Z331 Pregnant state, incidental: Secondary | ICD-10-CM

## 2020-04-12 LAB — POCT URINALYSIS DIPSTICK OB
Blood, UA: NEGATIVE
Glucose, UA: NEGATIVE
Ketones, UA: NEGATIVE
Nitrite, UA: NEGATIVE
POC,PROTEIN,UA: NEGATIVE

## 2020-04-12 NOTE — Progress Notes (Signed)
LOW-RISK PREGNANCY VISIT Patient name: Carolyn Ellis MRN [id number]  Date of birth: [birth year] Chief Complaint:   Routine Prenatal Visit  History of Present Illness:   Carolyn Ellis is a 24 y.o. G1P0 female at [redacted]w[redacted]d with an Estimated Date of Delivery: 08/11/20 being seen today for ongoing management of a low-risk pregnancy.  Depression screen [facility] 2/9 02/01/2020 12/09/2019  Decreased Interest 1 0  Down, Depressed, Hopeless 2 0  PHQ - 2 Score 3 0  Altered sleeping 2 -  Tired, decreased energy 3 -  Change in appetite 3 -  Feeling bad or failure about yourself  1 -  Trouble concentrating 0 -  Moving slowly or fidgety/restless 2 -  Suicidal thoughts 0 -  PHQ-9 Score 14 -  Difficult doing work/chores Somewhat difficult -    Today she reports increased discharge lately, no itching/odor/irritation, wants it checked out. Contractions: Not present. Vag. Bleeding: None.   . denies leaking of fluid. Review of Systems:   Pertinent items are noted in HPI Denies abnormal vaginal discharge w/ itching/odor/irritation, headaches, visual changes, shortness of breath, chest pain, abdominal pain, severe nausea/vomiting, or problems with urination or bowel movements unless otherwise stated above. Pertinent History Reviewed:  Reviewed past medical,surgical, social, obstetrical and family history.  Reviewed problem list, medications and allergies. Physical Assessment:   Vitals:   04/12/20 1346  BP: 125/70  Pulse: 97  Weight: 197 lb (89.4 kg)  Body mass index is 32.78 kg/m.        Physical Examination:   General appearance: Well appearing, and in no distress  Mental status: Alert, oriented to person, place, and time  Skin: Warm & dry  Cardiovascular: Normal heart rate noted  Respiratory: Normal respiratory effort, no distress  Abdomen: Soft, gravid, nontender  Pelvic: spec exam: cx visually closed, small amt white nonodorous d/c         Extremities: Edema: None  Fetal Status: Fetal Heart Rate  (bpm): 140 Fundal Height: 24 cm      Chaperone: Malachy Mood    Results for orders placed or performed in visit on 04/12/20 (from the past 24 hour(s))  POC Urinalysis Dipstick OB   Collection Time: 04/12/20  1:53 PM  Result Value Ref Range   Color, UA     Clarity, UA     Glucose, UA Negative Negative   Bilirubin, UA     Ketones, UA neg    Spec Grav, UA     Blood, UA neg    pH, UA     POC,PROTEIN,UA Negative Negative, Trace, Small (1+), Moderate (2+), Large (3+), 4+   Urobilinogen, UA     Nitrite, UA neg    Leukocytes, UA Small (1+) (A) Negative   Appearance     Odor      Assessment & Plan:  1) Low-risk pregnancy G1P0 at [redacted]w[redacted]d with an Estimated Date of Delivery: 08/11/20   2) Discharge, send CV swab   Meds: No orders of the defined types were placed in this encounter.  Labs/procedures today: spec exam w/ CV swab  Plan:  Continue routine obstetrical care  Next visit: prefers will be in person for pn2    Reviewed: Preterm labor symptoms and general obstetric precautions including but not limited to vaginal bleeding, contractions, leaking of fluid and fetal movement were reviewed in detail with the patient.  All questions were answered.  Follow-up: Return in about 4 weeks (around 05/10/2020) for LROB, PN2, CNM, in person.  Orders Placed This  Encounter  Procedures  . POC Urinalysis Dipstick OB   Roma Schanz CNM, [facility] 04/12/2020 2:04 PM

## 2020-04-12 NOTE — Patient Instructions (Signed)
Flavia Shipper Stirewalt, I greatly value your feedback.  If you receive a survey following your visit with [place] today, we appreciate you taking the time to fill it out.  Thanks, Joellyn Haff, CNM, WHNP-BC   You will have your sugar test next visit.  Please do not eat or drink anything after midnight the night before you come, not even water.  You will be here for at least two hours.  Please make an appointment online for the bloodwork at [email or web address] for 8:30am (or as close to this as possible). Make sure you select the [facility] service center. The day of the appointment, check in with our office first, then you will go to [facility] to start the sugar test.    [facility] at [facility] [place], [place] [address]) Entrance C, located off of [address] valet parking  Go to [email or web address] to register for FREE online childbirth classes   Call the office [phone]) or go to [facility] if:  You begin to have strong, frequent contractions  Your water breaks.  Sometimes it is a big gush of fluid, sometimes it is just a trickle that keeps getting your panties wet or running down your legs  You have vaginal bleeding.  It is normal to have a small amount of spotting if your cervix was checked.   You don't feel your baby moving like normal.  If you don't, get you something to eat and drink and lay down and focus on feeling your baby move.   If your baby is still not moving like normal, you should call the office or go to [facility].  [place] Pediatricians/Family Doctors:  [facility] [phone]            [facility] [phone]                 [facility] [phone] (usually not accepting new patients unless you have family there already, you are always welcome to call and ask)       [facility] Department [phone]       [facility] Pediatricians/Family Doctors:   [facility]:  [phone]  [facility]: [phone]  [facility]: [phone]  [facility] Doctors:   [facility]: [phone]   [facility]: [phone]  [facility] Doctors:  [facility]: [phone]   Home Blood Pressure Monitoring for Patients   Your provider has recommended that you check your blood pressure (BP) at least once a week at home. If you do not have a blood pressure cuff at home, one will be provided for you. Contact your provider if you have not received your monitor within 1 week.   Helpful Tips for Accurate Home Blood Pressure Checks  . Don't smoke, exercise, or drink caffeine 30 minutes before checking your BP . Use the restroom before checking your BP (a full bladder can raise your pressure) . Relax in a comfortable upright chair . Feet on the ground . Left arm resting comfortably on a flat surface at the level of your heart . Legs uncrossed . Back supported . Sit quietly and don't talk . Place the cuff on your bare arm . Adjust snuggly, so that only two fingertips can fit between your skin and the top of the cuff . Check 2 readings separated by at least one minute . Keep a log of your BP readings . For a visual, please  reference this diagram: http://ccnc.care/bpdiagram  Provider Name: [facility]     Phone: [phone]  Zone 1: ALL CLEAR  Continue to monitor your symptoms:  . BP reading is less than 140 (top number) or less than 90 (bottom number)  . No right upper stomach pain . No headaches or seeing spots . No feeling nauseated or throwing up . No swelling in face and hands  Zone 2: CAUTION Call your doctor's office for any of the following:  . BP reading is greater than 140 (top number) or greater than 90 (bottom number)  . Stomach pain under your ribs in the middle or right side . Headaches or seeing spots . Feeling nauseated or throwing up . Swelling in  face and hands  Zone 3: EMERGENCY  Seek immediate medical care if you have any of the following:  . BP reading is greater than160 (top number) or greater than 110 (bottom number) . Severe headaches not improving with Tylenol . Serious difficulty catching your breath . Any worsening symptoms from Zone 2   Second Trimester of Pregnancy The second trimester is from week 13 through week 28, months 4 through 6. The second trimester is often a time when you feel your best. Your body has also adjusted to being pregnant, and you begin to feel better physically. Usually, morning sickness has lessened or quit completely, you Showman have more energy, and you Salmons have an increase in appetite. The second trimester is also a time when the fetus is growing rapidly. At the end of the sixth month, the fetus is about 9 inches long and weighs about 1 pounds. You will likely begin to feel the baby move (quickening) between 18 and 20 weeks of the pregnancy. BODY CHANGES Your body goes through many changes during pregnancy. The changes vary from woman to woman.   Your weight will continue to increase. You will notice your lower abdomen bulging out.  You Pruden begin to get stretch marks on your hips, abdomen, and breasts.  You Sirmon develop headaches that can be relieved by medicines approved by your health care provider.  You Rissmiller urinate more often because the fetus is pressing on your bladder.  You Grey develop or continue to have heartburn as a result of your pregnancy.  You Corum develop constipation because certain hormones are causing the muscles that push waste through your intestines to slow down.  You Greenbaum develop hemorrhoids or swollen, bulging veins (varicose veins).  You Nordlund have back pain because of the weight gain and pregnancy hormones relaxing your joints between the bones in your pelvis and as a result of a shift in weight and the muscles that support your balance.  Your breasts will continue to grow  and be tender.  Your gums Corsello bleed and Fluty be sensitive to brushing and flossing.  Dark spots or blotches (chloasma, mask of pregnancy) Dollard develop on your face. This will likely fade after the baby is born.  A dark line from your belly button to the pubic area (linea nigra) Teaney appear. This will likely fade after the baby is born.  You Lapine have changes in your hair. These can include thickening of your hair, rapid growth, and changes in texture. Some women also have hair loss during or after pregnancy, or hair that feels dry or thin. Your hair will most likely return to normal after your baby is born. WHAT TO EXPECT AT YOUR PRENATAL VISITS During a routine prenatal visit:  You  will be weighed to make sure you and the fetus are growing normally.  Your blood pressure will be taken.  Your abdomen will be measured to track your baby's growth.  The fetal heartbeat will be listened to.  Any test results from the previous visit will be discussed. Your health care provider Tortora ask you:  How you are feeling.  If you are feeling the baby move.  If you have had any abnormal symptoms, such as leaking fluid, bleeding, severe headaches, or abdominal cramping.  If you have any questions. Other tests that Podesta be performed during your second trimester include:  Blood tests that check for:  Low iron levels (anemia).  Gestational diabetes (between 24 and 28 weeks).  Rh antibodies.  Urine tests to check for infections, diabetes, or protein in the urine.  An ultrasound to confirm the proper growth and development of the baby.  An amniocentesis to check for possible genetic problems.  Fetal screens for spina bifida and Down syndrome. HOME CARE INSTRUCTIONS   Avoid all smoking, herbs, alcohol, and unprescribed drugs. These chemicals affect the formation and growth of the baby.  Follow your health care provider's instructions regarding medicine use. There are medicines that are either  safe or unsafe to take during pregnancy.  Exercise only as directed by your health care provider. Experiencing uterine cramps is a good sign to stop exercising.  Continue to eat regular, healthy meals.  Wear a good support bra for breast tenderness.  Do not use hot tubs, steam rooms, or saunas.  Wear your seat belt at all times when driving.  Avoid raw meat, uncooked cheese, cat litter boxes, and soil used by cats. These carry germs that can cause birth defects in the baby.  Take your prenatal vitamins.  Try taking a stool softener (if your health care provider approves) if you develop constipation. Eat more high-fiber foods, such as fresh vegetables or fruit and whole grains. Drink plenty of fluids to keep your urine clear or pale yellow.  Take warm sitz baths to soothe any pain or discomfort caused by hemorrhoids. Use hemorrhoid cream if your health care provider approves.  If you develop varicose veins, wear support hose. Elevate your feet for 15 minutes, 3-4 times a day. Limit salt in your diet.  Avoid heavy lifting, wear low heel shoes, and practice good posture.  Rest with your legs elevated if you have leg cramps or low back pain.  Visit your dentist if you have not gone yet during your pregnancy. Use a soft toothbrush to brush your teeth and be gentle when you floss.  A sexual relationship Bacot be continued unless your health care provider directs you otherwise.  Continue to go to all your prenatal visits as directed by your health care provider. SEEK MEDICAL CARE IF:   You have dizziness.  You have mild pelvic cramps, pelvic pressure, or nagging pain in the abdominal area.  You have persistent nausea, vomiting, or diarrhea.  You have a bad smelling vaginal discharge.  You have pain with urination. SEEK IMMEDIATE MEDICAL CARE IF:   You have a fever.  You are leaking fluid from your vagina.  You have spotting or bleeding from your vagina.  You have severe  abdominal cramping or pain.  You have rapid weight gain or loss.  You have shortness of breath with chest pain.  You notice sudden or extreme swelling of your face, hands, ankles, feet, or legs.  You have not felt your baby move  in over an hour.  You have severe headaches that do not go away with medicine.  You have vision changes. Document Released: 10/09/2001 Document Revised: 10/20/2013 Document Reviewed: 12/16/2012 [facility] Patient Information 2015 [place], [place]. This information is not intended to replace advice given to you by your health care provider. Make sure you discuss any questions you have with your health care provider.

## 2020-04-14 LAB — CERVICOVAGINAL ANCILLARY ONLY
Bacterial Vaginitis (gardnerella): NEGATIVE
Candida Glabrata: NEGATIVE
Candida Vaginitis: NEGATIVE
Comment: NEGATIVE
Comment: NEGATIVE
Comment: NEGATIVE
Comment: NEGATIVE
Trichomonas: NEGATIVE

## 2020-05-10 ENCOUNTER — Other Ambulatory Visit: Payer: 59

## 2020-05-10 ENCOUNTER — Encounter: Payer: Self-pay | Admitting: Women's Health

## 2020-05-10 ENCOUNTER — Ambulatory Visit (INDEPENDENT_AMBULATORY_CARE_PROVIDER_SITE_OTHER): Payer: 59 | Admitting: Women's Health

## 2020-05-10 VITALS — BP 122/65 | HR 97 | Wt 202.0 lb

## 2020-05-10 DIAGNOSIS — O99891 Other specified diseases and conditions complicating pregnancy: Secondary | ICD-10-CM

## 2020-05-10 DIAGNOSIS — Z3402 Encounter for supervision of normal first pregnancy, second trimester: Secondary | ICD-10-CM

## 2020-05-10 DIAGNOSIS — Z3A26 26 weeks gestation of pregnancy: Secondary | ICD-10-CM

## 2020-05-10 DIAGNOSIS — Z331 Pregnant state, incidental: Secondary | ICD-10-CM

## 2020-05-10 DIAGNOSIS — Z1389 Encounter for screening for other disorder: Secondary | ICD-10-CM

## 2020-05-10 DIAGNOSIS — R12 Heartburn: Secondary | ICD-10-CM

## 2020-05-10 DIAGNOSIS — R42 Dizziness and giddiness: Secondary | ICD-10-CM

## 2020-05-10 DIAGNOSIS — Z131 Encounter for screening for diabetes mellitus: Secondary | ICD-10-CM

## 2020-05-10 LAB — POCT URINALYSIS DIPSTICK OB
Blood, UA: NEGATIVE
Glucose, UA: NEGATIVE
Ketones, UA: NEGATIVE
Leukocytes, UA: NEGATIVE
Nitrite, UA: NEGATIVE
POC,PROTEIN,UA: NEGATIVE

## 2020-05-10 NOTE — Patient Instructions (Addendum)
Carolyn Ellis, I greatly value your feedback.  If you receive a survey following your visit with [place] today, we appreciate you taking the time to fill it out.  Thanks, Joellyn Haff, CNM, WHNP-BC   [facility] at [facility] ([address] [place], [place] [address]) Entrance C, located off of [address] valet parking  Go to [email or web address] to register for FREE online childbirth classes   For Dizzy Spells:   This is usually related to either your blood sugar or your blood pressure dropping  Make sure you are staying well hydrated and drinking enough water so that your urine is clear  Eat small frequent meals and snacks containing protein (meat, eggs, nuts, cheese) so that your blood sugar doesn't drop  If you do get dizzy, sit/lay down and get you something to drink and a snack containing protein- you will usually start feeling better in 10-20 minutes     Call the office [phone]) or go to [facility] if:  You begin to have strong, frequent contractions  Your water breaks.  Sometimes it is a big gush of fluid, sometimes it is just a trickle that keeps getting your panties wet or running down your legs  You have vaginal bleeding.  It is normal to have a small amount of spotting if your cervix was checked.   You don't feel your baby moving like normal.  If you don't, get you something to eat and drink and lay down and focus on feeling your baby move.  You should feel at least 10 movements in 2 hours.  If you don't, you should call the office or go to [facility]    Tdap Vaccine  It is recommended that you get the Tdap vaccine during the third trimester of EACH pregnancy to help protect your baby from getting pertussis (whooping cough)  27-36 weeks is the BEST time to do this so that you can pass the protection on to your baby. During pregnancy is better than after pregnancy, but if you are unable to get it during pregnancy it will be offered at the  hospital.   You can get this vaccine with [place], at the health department, your family doctor, or some local pharmacies  Everyone who will be around your baby should also be up-to-date on their vaccines before the baby comes. Adults (who are not pregnant) only need 1 dose of Tdap during adulthood.   [place] Pediatricians/Family Doctors:  [facility] [phone]            [facility] [phone]                 [facility] [phone] (usually not accepting new patients unless you have family there already, you are always welcome to call and ask)       [facility] Department [phone]       [facility] Pediatricians/Family Doctors:   [facility]: [phone]  [facility]: [phone]  [facility]: [phone]  [facility] Doctors:   [facility]: [phone]   [facility]: [phone]  [facility] Doctors:  [facility]: [phone]   Home Blood Pressure Monitoring for Patients   Your provider has recommended that you check your blood pressure (BP) at least once a week at home. If you do not have a blood pressure cuff at home, one will be provided for you. Contact your provider if you have not received  your monitor within 1 week.   Helpful Tips for Accurate Home Blood Pressure Checks  . Don't smoke, exercise, or drink caffeine 30 minutes before checking your BP . Use the restroom before checking your BP (a full bladder can raise your pressure) . Relax in a comfortable upright chair . Feet on the ground . Left arm resting comfortably on a flat surface at the level of your heart . Legs uncrossed . Back supported . Sit quietly and don't talk . Place the cuff on your bare arm . Adjust snuggly, so that only two fingertips can fit between your skin and the top of the cuff . Check 2 readings separated by at least one  minute . Keep a log of your BP readings . For a visual, please reference this diagram: http://ccnc.care/bpdiagram  Provider Name: [facility]     Phone: [phone]  Zone 1: ALL CLEAR  Continue to monitor your symptoms:  . BP reading is less than 140 (top number) or less than 90 (bottom number)  . No right upper stomach pain . No headaches or seeing spots . No feeling nauseated or throwing up . No swelling in face and hands  Zone 2: CAUTION Call your doctor's office for any of the following:  . BP reading is greater than 140 (top number) or greater than 90 (bottom number)  . Stomach pain under your ribs in the middle or right side . Headaches or seeing spots . Feeling nauseated or throwing up . Swelling in face and hands  Zone 3: EMERGENCY  Seek immediate medical care if you have any of the following:  . BP reading is greater than160 (top number) or greater than 110 (bottom number) . Severe headaches not improving with Tylenol . Serious difficulty catching your breath . Any worsening symptoms from Zone 2   Third Trimester of Pregnancy The third trimester is from week 29 through week 42, months 7 through 9. The third trimester is a time when the fetus is growing rapidly. At the end of the ninth month, the fetus is about 20 inches in length and weighs 6-10 pounds.  BODY CHANGES Your body goes through many changes during pregnancy. The changes vary from woman to woman.   Your weight will continue to increase. You can expect to gain 25-35 pounds (11-16 kg) by the end of the pregnancy.  You Szeto begin to get stretch marks on your hips, abdomen, and breasts.  You Conrad urinate more often because the fetus is moving lower into your pelvis and pressing on your bladder.  You Hendry develop or continue to have heartburn as a result of your pregnancy.  You Honeywell develop constipation because certain hormones are causing the muscles that push waste through your intestines to slow  down.  You Liera develop hemorrhoids or swollen, bulging veins (varicose veins).  You Mcelveen have pelvic pain because of the weight gain and pregnancy hormones relaxing your joints between the bones in your pelvis. Backaches Heffner result from overexertion of the muscles supporting your posture.  You Burchill have changes in your hair. These can include thickening of your hair, rapid growth, and changes in texture. Some women also have hair loss during or after pregnancy, or hair that feels dry or thin. Your hair will most likely return to normal after your baby is born.  Your breasts will continue to grow and be tender. A yellow discharge Berkovich leak from your breasts called colostrum.  Your belly button Schafer stick out.  You Kahl feel short of breath because of your expanding uterus.  You Mooers notice the fetus "dropping," or moving lower in your abdomen.  You Birchall have a bloody mucus discharge. This usually occurs a few days to a week before labor begins.  Your cervix becomes thin and soft (effaced) near your due date. WHAT TO EXPECT AT YOUR PRENATAL EXAMS  You will have prenatal exams every 2 weeks until week 36. Then, you will have weekly prenatal exams. During a routine prenatal visit:  You will be weighed to make sure you and the fetus are growing normally.  Your blood pressure is taken.  Your abdomen will be measured to track your baby's growth.  The fetal heartbeat will be listened to.  Any test results from the previous visit will be discussed.  You Rodenberg have a cervical check near your due date to see if you have effaced. At around 36 weeks, your caregiver will check your cervix. At the same time, your caregiver will also perform a test on the secretions of the vaginal tissue. This test is to determine if a type of bacteria, Group B streptococcus, is present. Your caregiver will explain this further. Your caregiver Chisenhall ask you:  What your birth plan is.  How you are feeling.  If you are  feeling the baby move.  If you have had any abnormal symptoms, such as leaking fluid, bleeding, severe headaches, or abdominal cramping.  If you have any questions. Other tests or screenings that Cherry be performed during your third trimester include:  Blood tests that check for low iron levels (anemia).  Fetal testing to check the health, activity level, and growth of the fetus. Testing is done if you have certain medical conditions or if there are problems during the pregnancy. FALSE LABOR You Clock feel small, irregular contractions that eventually go away. These are called Braxton Hicks contractions, or false labor. Contractions Mahn last for hours, days, or even weeks before true labor sets in. If contractions come at regular intervals, intensify, or become painful, it is best to be seen by your caregiver.  SIGNS OF LABOR   Menstrual-like cramps.  Contractions that are 5 minutes apart or less.  Contractions that start on the top of the uterus and spread down to the lower abdomen and back.  A sense of increased pelvic pressure or back pain.  A watery or bloody mucus discharge that comes from the vagina. If you have any of these signs before the 37th week of pregnancy, call your caregiver right away. You need to go to the hospital to get checked immediately. HOME CARE INSTRUCTIONS   Avoid all smoking, herbs, alcohol, and unprescribed drugs. These chemicals affect the formation and growth of the baby.  Follow your caregiver's instructions regarding medicine use. There are medicines that are either safe or unsafe to take during pregnancy.  Exercise only as directed by your caregiver. Experiencing uterine cramps is a good sign to stop exercising.  Continue to eat regular, healthy meals.  Wear a good support bra for breast tenderness.  Do not use hot tubs, steam rooms, or saunas.  Wear your seat belt at all times when driving.  Avoid raw meat, uncooked cheese, cat litter boxes, and  soil used by cats. These carry germs that can cause birth defects in the baby.  Take your prenatal vitamins.  Try taking a stool softener (if your caregiver approves) if you develop constipation. Eat more high-fiber foods, such as fresh vegetables or  fruit and whole grains. Drink plenty of fluids to keep your urine clear or pale yellow.  Take warm sitz baths to soothe any pain or discomfort caused by hemorrhoids. Use hemorrhoid cream if your caregiver approves.  If you develop varicose veins, wear support hose. Elevate your feet for 15 minutes, 3-4 times a day. Limit salt in your diet.  Avoid heavy lifting, wear low heal shoes, and practice good posture.  Rest a lot with your legs elevated if you have leg cramps or low back pain.  Visit your dentist if you have not gone during your pregnancy. Use a soft toothbrush to brush your teeth and be gentle when you floss.  A sexual relationship Sadowski be continued unless your caregiver directs you otherwise.  Do not travel far distances unless it is absolutely necessary and only with the approval of your caregiver.  Take prenatal classes to understand, practice, and ask questions about the labor and delivery.  Make a trial run to the hospital.  Pack your hospital bag.  Prepare the baby's nursery.  Continue to go to all your prenatal visits as directed by your caregiver. SEEK MEDICAL CARE IF:  You are unsure if you are in labor or if your water has broken.  You have dizziness.  You have mild pelvic cramps, pelvic pressure, or nagging pain in your abdominal area.  You have persistent nausea, vomiting, or diarrhea.  You have a bad smelling vaginal discharge.  You have pain with urination. SEEK IMMEDIATE MEDICAL CARE IF:   You have a fever.  You are leaking fluid from your vagina.  You have spotting or bleeding from your vagina.  You have severe abdominal cramping or pain.  You have rapid weight loss or gain.  You have shortness  of breath with chest pain.  You notice sudden or extreme swelling of your face, hands, ankles, feet, or legs.  You have not felt your baby move in over an hour.  You have severe headaches that do not go away with medicine.  You have vision changes. Document Released: 10/09/2001 Document Revised: 10/20/2013 Document Reviewed: 12/16/2012 [facility] Patient Information 2015 [place], [place]. This information is not intended to replace advice given to you by your health care provider. Make sure you discuss any questions you have with your health care provider.

## 2020-05-10 NOTE — Progress Notes (Signed)
LOW-RISK PREGNANCY VISIT Patient name: Carolyn Ellis MRN [id number]  Date of birth: 04-23-1996 Chief Complaint:   Routine Prenatal Visit (PN2)  History of Present Illness:   Carolyn Ellis is a 24 y.o. G1P0 female at [redacted]w[redacted]d with an Estimated Date of Delivery: 08/11/20 being seen today for ongoing management of a low-risk pregnancy.  Depression screen [facility] 2/9 02/01/2020 12/09/2019  Decreased Interest 1 0  Down, Depressed, Hopeless 2 0  PHQ - 2 Score 3 0  Altered sleeping 2 -  Tired, decreased energy 3 -  Change in appetite 3 -  Feeling bad or failure about yourself  1 -  Trouble concentrating 0 -  Moving slowly or fidgety/restless 2 -  Suicidal thoughts 0 -  PHQ-9 Score 14 -  Difficult doing work/chores Somewhat difficult -    Today she reports heartburn, TUMS help. Felt bad yesterday, bit into a banana and noticed blood- gums were bleeding. HR was 68 on her watch, got up and went up front at work, HR was 125, layed down w/ fan for a few minutes and felt better. Contractions: Not present. Vag. Bleeding: None.   . denies leaking of fluid. Review of Systems:   Pertinent items are noted in HPI Denies abnormal vaginal discharge w/ itching/odor/irritation, headaches, visual changes, shortness of breath, chest pain, abdominal pain, severe nausea/vomiting, or problems with urination or bowel movements unless otherwise stated above. Pertinent History Reviewed:  Reviewed past medical,surgical, social, obstetrical and family history.  Reviewed problem list, medications and allergies. Physical Assessment:   Vitals:   05/10/20 0849  BP: 122/65  Pulse: 97  Weight: 202 lb (91.6 kg)  Body mass index is 33.61 kg/m.        Physical Examination:   General appearance: Well appearing, and in no distress  Mental status: Alert, oriented to person, place, and time  Skin: Warm & dry  Cardiovascular: Normal heart rate noted  Respiratory: Normal respiratory effort, no distress  Abdomen: Soft, gravid,  nontender  Pelvic: Cervical exam deferred         Extremities: Edema: None  Fetal Status: Fetal Heart Rate (bpm): 140 Fundal Height: 28 cm      Chaperone: n/a    Results for orders placed or performed in visit on 05/10/20 (from the past 24 hour(s))  POC Urinalysis Dipstick OB   Collection Time: 05/10/20  8:47 AM  Result Value Ref Range   Color, UA     Clarity, UA     Glucose, UA Negative Negative   Bilirubin, UA     Ketones, UA neg    Spec Grav, UA     Blood, UA neg    pH, UA     POC,PROTEIN,UA Negative Negative, Trace, Small (1+), Moderate (2+), Large (3+), 4+   Urobilinogen, UA     Nitrite, UA neg    Leukocytes, UA Negative Negative   Appearance     Odor      Assessment & Plan:  1) Low-risk pregnancy G1P0 at [redacted]w[redacted]d with an Estimated Date of Delivery: 08/11/20   2) Heartburn, TUMS prn  3) Dizzy spell> gave printed prevention/relief measures   4) Bleeding gums> yesterday, recommended making dentist appt if she hasn't had a recent one, floss daily   Meds: No orders of the defined types were placed in this encounter.  Labs/procedures today: pn2, wants tdap next visit  Plan:  Continue routine obstetrical care  Next visit: prefers in person    Reviewed: Preterm labor symptoms and  general obstetric precautions including but not limited to vaginal bleeding, contractions, leaking of fluid and fetal movement were reviewed in detail with the patient.  All questions were answered.  Follow-up: Return in about 4 weeks (around 06/07/2020) for LROB, CNM.  Orders Placed This Encounter  Procedures  . Tdap vaccine greater than or equal to 7yo IM  . POC Urinalysis Dipstick OB   Cheral Marker CNM, [facility] 05/10/2020 9:15 AM

## 2020-05-11 LAB — CBC
Hematocrit: 32.7 % — ABNORMAL LOW (ref 34.0–46.6)
Hemoglobin: 10.7 g/dL — ABNORMAL LOW (ref 11.1–15.9)
MCH: 27.8 pg (ref 26.6–33.0)
MCHC: 32.7 g/dL (ref 31.5–35.7)
MCV: 85 fL (ref 79–97)
Platelets: 234 10*3/uL (ref 150–450)
RBC: 3.85 x10E6/uL (ref 3.77–5.28)
RDW: 12.7 % (ref 11.7–15.4)
WBC: 13.3 10*3/uL — ABNORMAL HIGH (ref 3.4–10.8)

## 2020-05-11 LAB — GLUCOSE TOLERANCE, 2 HOURS W/ 1HR
Glucose, 1 hour: 145 mg/dL (ref 65–179)
Glucose, 2 hour: 93 mg/dL (ref 65–152)
Glucose, Fasting: 82 mg/dL (ref 65–91)

## 2020-05-11 LAB — HIV ANTIBODY (ROUTINE TESTING W REFLEX): HIV Screen 4th Generation wRfx: NONREACTIVE

## 2020-05-11 LAB — ANTIBODY SCREEN: Antibody Screen: NEGATIVE

## 2020-05-11 LAB — RPR: RPR Ser Ql: NONREACTIVE

## 2020-06-07 ENCOUNTER — Encounter: Payer: Self-pay | Admitting: Obstetrics & Gynecology

## 2020-06-07 ENCOUNTER — Ambulatory Visit (INDEPENDENT_AMBULATORY_CARE_PROVIDER_SITE_OTHER): Payer: 59 | Admitting: Obstetrics & Gynecology

## 2020-06-07 VITALS — BP 106/68 | HR 96 | Wt 210.5 lb

## 2020-06-07 DIAGNOSIS — Z1389 Encounter for screening for other disorder: Secondary | ICD-10-CM

## 2020-06-07 DIAGNOSIS — Z3403 Encounter for supervision of normal first pregnancy, third trimester: Secondary | ICD-10-CM

## 2020-06-07 DIAGNOSIS — Z331 Pregnant state, incidental: Secondary | ICD-10-CM | POA: Diagnosis not present

## 2020-06-07 DIAGNOSIS — Z23 Encounter for immunization: Secondary | ICD-10-CM

## 2020-06-07 DIAGNOSIS — Z3A30 30 weeks gestation of pregnancy: Secondary | ICD-10-CM | POA: Diagnosis not present

## 2020-06-07 LAB — POCT URINALYSIS DIPSTICK OB
Glucose, UA: NEGATIVE
Ketones, UA: NEGATIVE
Leukocytes, UA: NEGATIVE
Nitrite, UA: NEGATIVE
POC,PROTEIN,UA: NEGATIVE

## 2020-06-07 NOTE — Progress Notes (Signed)
LOW-RISK PREGNANCY VISIT Patient name: Carolyn Ellis MRN [id number]  Date of birth: Jan 31, 1996 Chief Complaint:   Routine Prenatal Visit  History of Present Illness:   Carolyn Ellis is a 24 y.o. G1P0 female at [redacted]w[redacted]d with an Estimated Date of Delivery: 08/11/20 being seen today for ongoing management of a low-risk pregnancy.  Depression screen [facility] 2/9 05/10/2020 02/01/2020 12/09/2019  Decreased Interest 0 1 0  Down, Depressed, Hopeless 0 2 0  PHQ - 2 Score 0 3 0  Altered sleeping 0 2 -  Tired, decreased energy 1 3 -  Change in appetite 0 3 -  Feeling bad or failure about yourself  0 1 -  Trouble concentrating 1 0 -  Moving slowly or fidgety/restless 0 2 -  Suicidal thoughts 0 0 -  PHQ-9 Score 2 14 -  Difficult doing work/chores Somewhat difficult Somewhat difficult -    Today she reports no complaints. Contractions: Not present. Vag. Bleeding: None.  Movement: Present. denies leaking of fluid. Review of Systems:   Pertinent items are noted in HPI Denies abnormal vaginal discharge w/ itching/odor/irritation, headaches, visual changes, shortness of breath, chest pain, abdominal pain, severe nausea/vomiting, or problems with urination or bowel movements unless otherwise stated above. Pertinent History Reviewed:  Reviewed past medical,surgical, social, obstetrical and family history.  Reviewed problem list, medications and allergies. Physical Assessment:   Vitals:   06/07/20 1552  BP: 106/68  Pulse: 96  Weight: 210 lb 8 oz (95.5 kg)  Body mass index is 35.03 kg/m.        Physical Examination:   General appearance: Well appearing, and in no distress  Mental status: Alert, oriented to person, place, and time  Skin: Warm & dry  Cardiovascular: Normal heart rate noted  Respiratory: Normal respiratory effort, no distress  Abdomen: Soft, gravid, nontender  Pelvic: Cervical exam deferred         Extremities: Edema: Trace  Fetal Status: Fetal Heart Rate (bpm): 140 Fundal Height: 32 cm  Movement: Present    Chaperone: n/a    Results for orders placed or performed in visit on 06/07/20 (from the past 24 hour(s))  POC Urinalysis Dipstick OB   Collection Time: 06/07/20  3:53 PM  Result Value Ref Range   Color, UA     Clarity, UA     Glucose, UA Negative Negative   Bilirubin, UA     Ketones, UA neg    Spec Grav, UA     Blood, UA 2+    pH, UA     POC,PROTEIN,UA Negative Negative, Trace, Small (1+), Moderate (2+), Large (3+), 4+   Urobilinogen, UA     Nitrite, UA neg    Leukocytes, UA Negative Negative   Appearance     Odor      Assessment & Plan:  1) Low-risk pregnancy G1P0 at [redacted]w[redacted]d with an Estimated Date of Delivery: 08/11/20   2) ,    Meds: No orders of the defined types were placed in this encounter.  Labs/procedures today:   Plan:  Continue routine obstetrical care  Next visit: prefers in person    Reviewed: Preterm labor symptoms and general obstetric precautions including but not limited to vaginal bleeding, contractions, leaking of fluid and fetal movement were reviewed in detail with the patient.  All questions were answered. Has home bp cuff. Rx faxed to . Check bp weekly, let us know if >140/90.   Follow-up: Return in about 3 weeks (around 06/28/2020) for LROB.  Orders  Placed This Encounter  Procedures   Tdap vaccine greater than or equal to 7yo IM   POC Urinalysis Dipstick OB   Carolyn Ellis  06/07/2020 4:25 PM

## 2020-06-28 ENCOUNTER — Ambulatory Visit (INDEPENDENT_AMBULATORY_CARE_PROVIDER_SITE_OTHER): Payer: 59 | Admitting: Women's Health

## 2020-06-28 ENCOUNTER — Encounter: Payer: Self-pay | Admitting: Women's Health

## 2020-06-28 VITALS — BP 130/70 | HR 85 | Wt 220.0 lb

## 2020-06-28 DIAGNOSIS — O99013 Anemia complicating pregnancy, third trimester: Secondary | ICD-10-CM

## 2020-06-28 DIAGNOSIS — Z3403 Encounter for supervision of normal first pregnancy, third trimester: Secondary | ICD-10-CM

## 2020-06-28 DIAGNOSIS — Z3A33 33 weeks gestation of pregnancy: Secondary | ICD-10-CM

## 2020-06-28 DIAGNOSIS — Z1389 Encounter for screening for other disorder: Secondary | ICD-10-CM

## 2020-06-28 DIAGNOSIS — Z331 Pregnant state, incidental: Secondary | ICD-10-CM | POA: Diagnosis not present

## 2020-06-28 LAB — POCT URINALYSIS DIPSTICK OB
Blood, UA: NEGATIVE
Glucose, UA: NEGATIVE
Ketones, UA: NEGATIVE
Leukocytes, UA: NEGATIVE
Nitrite, UA: NEGATIVE
POC,PROTEIN,UA: NEGATIVE

## 2020-06-28 LAB — POCT HEMOGLOBIN: Hemoglobin: 9.4 g/dL — AB (ref 11–14.6)

## 2020-06-28 MED ORDER — FERROUS SULFATE 325 (65 FE) MG PO TABS: 325.0000 mg | ORAL_TABLET | Freq: Two times a day (BID) | ORAL | 3 refills | Status: DC

## 2020-06-28 NOTE — Patient Instructions (Addendum)
Flavia Shipper Offutt, I greatly value your feedback.  If you receive a survey following your visit with [place] today, we appreciate you taking the time to fill it out.  Thanks, Joellyn Haff, CNM, WHNP-BC  [facility] ([address] [place], [place] [address]) Entrance C, located off of [address] valet parking   Go to [email or web address] to register for FREE online childbirth classes    Call the office [phone]) or go to [facility] if:  You begin to have strong, frequent contractions  Your water breaks.  Sometimes it is a big gush of fluid, sometimes it is just a trickle that keeps getting your panties wet or running down your legs  You have vaginal bleeding.  It is normal to have a small amount of spotting if your cervix was checked.   You don't feel your baby moving like normal.  If you don't, get you something to eat and drink and lay down and focus on feeling your baby move.  You should feel at least 10 movements in 2 hours.  If you don't, you should call the office or go to [facility].   Call the office [phone]) or go to [facility] hospital for these signs of pre-eclampsia:  Severe headache that does not go away with Tylenol  Visual changes- seeing spots, double, blurred vision  Pain under your right breast or upper abdomen that does not go away with Tums or heartburn medicine  Nausea and/or vomiting  Severe swelling in your hands, feet, and face    Home Blood Pressure Monitoring for Patients   Your provider has recommended that you check your blood pressure (BP) at least once a week at home. If you do not have a blood pressure cuff at home, one will be provided for you. Contact your provider if you have not received your monitor within 1 week.   Helpful Tips for Accurate Home Blood Pressure Checks  . Don't smoke, exercise, or drink caffeine 30 minutes before checking your BP . Use the restroom before checking your BP (a full bladder  can raise your pressure) . Relax in a comfortable upright chair . Feet on the ground . Left arm resting comfortably on a flat surface at the level of your heart . Legs uncrossed . Back supported . Sit quietly and don't talk . Place the cuff on your bare arm . Adjust snuggly, so that only two fingertips can fit between your skin and the top of the cuff . Check 2 readings separated by at least one minute . Keep a log of your BP readings . For a visual, please reference this diagram: http://ccnc.care/bpdiagram  Provider Name: [facility]     Phone: [phone]  Zone 1: ALL CLEAR  Continue to monitor your symptoms:  . BP reading is less than 140 (top number) or less than 90 (bottom number)  . No right upper stomach pain . No headaches or seeing spots . No feeling nauseated or throwing up . No swelling in face and hands  Zone 2: CAUTION Call your doctor's office for any of the following:  . BP reading is greater than 140 (top number) or greater than 90 (bottom number)  . Stomach pain under your ribs in the middle or right side . Headaches or seeing spots . Feeling nauseated or throwing up . Swelling in face and hands  Zone 3: EMERGENCY  Seek immediate medical care if you have any of  the following:  . BP reading is greater than160 (top number) or greater than 110 (bottom number) . Severe headaches not improving with Tylenol . Serious difficulty catching your breath . Any worsening symptoms from Zone 2  Preterm Labor and Birth Information  The normal length of a pregnancy is 39-41 weeks. Preterm labor is when labor starts before 37 completed weeks of pregnancy. What are the risk factors for preterm labor? Preterm labor is more likely to occur in women who:  Have certain infections during pregnancy such as a bladder infection, sexually transmitted infection, or infection inside the uterus (chorioamnionitis).  Have a shorter-than-normal cervix.  Have gone into preterm  labor before.  Have had surgery on their cervix.  Are younger than age 24 or older than age 36.  Are African American.  Are pregnant with twins or multiple babies (multiple gestation).  Take street drugs or smoke while pregnant.  Do not gain enough weight while pregnant.  Became pregnant shortly after having been pregnant. What are the symptoms of preterm labor? Symptoms of preterm labor include:  Cramps similar to those that can happen during a menstrual period. The cramps Papandrea happen with diarrhea.  Pain in the abdomen or lower back.  Regular uterine contractions that Vanwey feel like tightening of the abdomen.  A feeling of increased pressure in the pelvis.  Increased watery or bloody mucus discharge from the vagina.  Water breaking (ruptured amniotic sac). Why is it important to recognize signs of preterm labor? It is important to recognize signs of preterm labor because babies who are born prematurely Lucero not be fully developed. This can put them at an increased risk for:  Long-term (chronic) heart and lung problems.  Difficulty immediately after birth with regulating body systems, including blood sugar, body temperature, heart rate, and breathing rate.  Bleeding in the brain.  Cerebral palsy.  Learning difficulties.  Death. These risks are highest for babies who are born before 43 weeks of pregnancy. How is preterm labor treated? Treatment depends on the length of your pregnancy, your condition, and the health of your baby. It Rotter involve: 1. Having a stitch (suture) placed in your cervix to prevent your cervix from opening too early (cerclage). 2. Taking or being given medicines, such as: ? Hormone medicines. These Huaracha be given early in pregnancy to help support the pregnancy. ? Medicine to stop contractions. ? Medicines to help mature the baby's lungs. These Pettus be prescribed if the risk of delivery is high. ? Medicines to prevent your baby from developing  cerebral palsy. If the labor happens before 34 weeks of pregnancy, you Jaggi need to stay in the hospital. What should I do if I think I am in preterm labor? If you think that you are going into preterm labor, call your health care provider right away. How can I prevent preterm labor in future pregnancies? To increase your chance of having a full-term pregnancy:  Do not use any tobacco products, such as cigarettes, chewing tobacco, and e-cigarettes. If you need help quitting, ask your health care provider.  Do not use street drugs or medicines that have not been prescribed to you during your pregnancy.  Talk with your health care provider before taking any herbal supplements, even if you have been taking them regularly.  Make sure you gain a healthy amount of weight during your pregnancy.  Watch for infection. If you think that you might have an infection, get it checked right away.  Make sure to  tell your health care provider if you have gone into preterm labor before. This information is not intended to replace advice given to you by your health care provider. Make sure you discuss any questions you have with your health care provider. Document Revised: 02/06/2019 Document Reviewed: 03/07/2016 Elsevier Patient Education  2020 Elsevier Inc.   Pregnancy and Anemia  Anemia is a condition in which the concentration of red blood cells, or hemoglobin, in the blood is below normal. Hemoglobin is a substance in red blood cells that carries oxygen to the tissues of the body. Anemia results when enough oxygen does not reach these tissues. Anemia is common during pregnancy because the woman's body needs more blood volume and blood cells to provide nutrition to the fetus. The fetus needs iron and folic acid as it is developing. Your body Manka not produce enough red blood cells because of this. Also, during pregnancy, the liquid part of the blood (plasma) increases by about 30-50%, and the red blood  cells increase by only 20%. This lowers the concentration of the red blood cells and creates a natural anemia-like situation. What are the causes? The most common cause of anemia during pregnancy is not having enough iron in the body to make red blood cells (iron deficiency anemia). Other causes Andrzejewski include:  Folic acid deficiency.  Vitamin B12 deficiency.  Certain prescription or over-the-counter medicines.  Certain medical conditions or infections that destroy red blood cells.  A low platelet count and bleeding caused by antibodies that go through the placenta to the fetus from the mother's blood. What are the signs or symptoms? Mild anemia Thelander not be noticeable. If it becomes severe, symptoms Andrades include:  Feeling tired (fatigue).  Shortness of breath, especially during activity.  Weakness.  Fainting.  Pale looking skin.  Headaches.  A fast or irregular heartbeat (palpitations).  Dizziness. How is this diagnosed? This condition Dauria be diagnosed based on:  Your medical history and a physical exam.  Blood tests. How is this treated? Treatment for anemia during pregnancy depends on the cause of the anemia. Treatment can include:  Dietary changes.  Supplements of iron, vitamin B12, or folic acid.  A blood transfusion. This Paulsen be needed if anemia is severe.  Hospitalization. This Polendo be needed if there is a lot of blood loss or severe anemia. Follow these instructions at home:  Follow recommendations from your dietitian or health care provider about changing your diet.  Increase your vitamin C intake. This will help the stomach absorb more iron. Some foods that are high in vitamin C include: ? Oranges. ? Peppers. ? Tomatoes. ? Mangoes.  Eat a diet rich in iron. This would include foods such as: ? Liver. ? Beef. ? Eggs. ? Whole grains. ? Spinach. ? Dried fruit.  Take iron and vitamins as told by your health care provider.  Eat green leafy vegetables.  These are a good source of folic acid.  Keep all follow-up visits as told by your health care provider. This is important. Contact a health care provider if:  You have frequent or lasting headaches.  You look pale.  You bruise easily. Get help right away if:  You have extreme weakness, shortness of breath, or chest pain.  You become dizzy or have trouble concentrating.  You have heavy vaginal bleeding.  You develop a rash.  You have bloody or black, tarry stools.  You faint.  You vomit up blood.  You vomit repeatedly.  You have abdominal  pain.  You have a fever.  You are dehydrated. Summary  Anemia is a condition in which the concentration of red blood cells or hemoglobin in the blood is below normal.  Anemia is common during pregnancy because the woman's body needs more blood volume and blood cells to provide nutrition to the fetus.  The most common cause of anemia during pregnancy is not having enough iron in the body to make red blood cells (iron deficiency anemia).  Mild anemia Lipa not be noticeable. If it becomes severe, symptoms Stelmach include feeling tired and weak. This information is not intended to replace advice given to you by your health care provider. Make sure you discuss any questions you have with your health care provider. Document Revised: 03/31/2019 Document Reviewed: 11/20/2016 Elsevier Patient Education  2020 [employer].

## 2020-06-28 NOTE — Progress Notes (Signed)
° °  LOW-RISK PREGNANCY VISIT Patient name: Carolyn Ellis MRN [id number]  Date of birth: 1996-03-09 Chief Complaint:   Routine Prenatal Visit  History of Present Illness:   Carolyn Ellis is a 24 y.o. G1P0 female at [redacted]w[redacted]d with an Estimated Date of Delivery: 08/11/20 being seen today for ongoing management of a low-risk pregnancy.  Depression screen [facility] 2/9 05/10/2020 02/01/2020 12/09/2019  Decreased Interest 0 1 0  Down, Depressed, Hopeless 0 2 0  PHQ - 2 Score 0 3 0  Altered sleeping 0 2 -  Tired, decreased energy 1 3 -  Change in appetite 0 3 -  Feeling bad or failure about yourself  0 1 -  Trouble concentrating 1 0 -  Moving slowly or fidgety/restless 0 2 -  Suicidal thoughts 0 0 -  PHQ-9 Score 2 14 -  Difficult doing work/chores Somewhat difficult Somewhat difficult -    Today she reports lower abd tender, restless legs while driving, metallic taste in mouth. Contractions: Not present. Vag. Bleeding: None.  Movement: Present. denies leaking of fluid. Review of Systems:   Pertinent items are noted in HPI Denies abnormal vaginal discharge w/ itching/odor/irritation, headaches, visual changes, shortness of breath, chest pain, abdominal pain, severe nausea/vomiting, or problems with urination or bowel movements unless otherwise stated above. Pertinent History Reviewed:  Reviewed past medical,surgical, social, obstetrical and family history.  Reviewed problem list, medications and allergies. Physical Assessment:   Vitals:   06/28/20 1549  BP: 130/70  Pulse: 85  Weight: 220 lb (99.8 kg)  Body mass index is 36.61 kg/m.        Physical Examination:   General appearance: Well appearing, and in no distress  Mental status: Alert, oriented to person, place, and time  Skin: Warm & dry  Cardiovascular: Normal heart rate noted  Respiratory: Normal respiratory effort, no distress  Abdomen: Soft, gravid, nontender, lower abd dependent edem  Pelvic: Cervical exam deferred         Extremities:  Edema: Trace  Fetal Status: Fetal Heart Rate (bpm): 140 Fundal Height: 34 cm Movement: Present    Chaperone: n/a    No results found for this or any previous visit (from the past 24 hour(s)).  Assessment & Plan:  1) Low-risk pregnancy G1P0 at [redacted]w[redacted]d with an Estimated Date of Delivery: 08/11/20   2) Restless legs, checked hgb, 9.4, will check CBC and rx fe bid  3) Anemia> check cbc, rx fe bid, increase foods high in Fe   Meds:  Meds ordered this encounter  Medications   ferrous sulfate 325 (65 FE) MG tablet    Sig: Take 1 tablet (325 mg total) by mouth 2 (two) times daily with a meal.    Dispense:  60 tablet    Refill:  3    Order Specific Question:   Supervising Provider    Answer:   Lazaro Arms [[id number]]   Labs/procedures today: hgb, cbc  Plan:  Continue routine obstetrical care  Next visit: prefers in person    Reviewed: Preterm labor symptoms and general obstetric precautions including but not limited to vaginal bleeding, contractions, leaking of fluid and fetal movement were reviewed in detail with the patient.  All questions were answered.   Follow-up: Return in about 2 weeks (around 07/12/2020) for LROB, in person, CNM.  Orders Placed This Encounter  Procedures   CBC   POC Urinalysis Dipstick OB   Cheral Marker CNM, [facility] 06/28/2020 4:36 PM

## 2020-06-29 LAB — CBC
Hematocrit: 29 % — ABNORMAL LOW (ref 34.0–46.6)
Hemoglobin: 9.4 g/dL — ABNORMAL LOW (ref 11.1–15.9)
MCH: 25.4 pg — ABNORMAL LOW (ref 26.6–33.0)
MCHC: 32.4 g/dL (ref 31.5–35.7)
MCV: 78 fL — ABNORMAL LOW (ref 79–97)
Platelets: 228 10*3/uL (ref 150–450)
RBC: 3.7 x10E6/uL — ABNORMAL LOW (ref 3.77–5.28)
RDW: 13.7 % (ref 11.7–15.4)
WBC: 13.2 10*3/uL — ABNORMAL HIGH (ref 3.4–10.8)

## 2020-07-12 ENCOUNTER — Encounter: Payer: Self-pay | Admitting: Women's Health

## 2020-07-12 ENCOUNTER — Ambulatory Visit (INDEPENDENT_AMBULATORY_CARE_PROVIDER_SITE_OTHER): Payer: 59 | Admitting: Women's Health

## 2020-07-12 VITALS — BP 117/71 | HR 102 | Wt 226.8 lb

## 2020-07-12 DIAGNOSIS — Z029 Encounter for administrative examinations, unspecified: Secondary | ICD-10-CM

## 2020-07-12 DIAGNOSIS — Z1389 Encounter for screening for other disorder: Secondary | ICD-10-CM | POA: Diagnosis not present

## 2020-07-12 DIAGNOSIS — Z3A35 35 weeks gestation of pregnancy: Secondary | ICD-10-CM | POA: Diagnosis not present

## 2020-07-12 DIAGNOSIS — Z3403 Encounter for supervision of normal first pregnancy, third trimester: Secondary | ICD-10-CM

## 2020-07-12 DIAGNOSIS — Z331 Pregnant state, incidental: Secondary | ICD-10-CM

## 2020-07-12 LAB — POCT URINALYSIS DIPSTICK OB
Glucose, UA: NEGATIVE
Ketones, UA: NEGATIVE
Leukocytes, UA: NEGATIVE
Nitrite, UA: NEGATIVE

## 2020-07-12 NOTE — Progress Notes (Signed)
LOW-RISK PREGNANCY VISIT Patient name: Carolyn Ellis MRN [id number]  Date of birth: 1996/09/05 Chief Complaint:   Routine Prenatal Visit (rash on feet; bumps on fingers)  History of Present Illness:   Carolyn Ellis is a 24 y.o. G1P0 female at [redacted]w[redacted]d with an Estimated Date of Delivery: 08/11/20 being seen today for ongoing management of a low-risk pregnancy.  Depression screen [facility] 2/9 05/10/2020 02/01/2020 12/09/2019  Decreased Interest 0 1 0  Down, Depressed, Hopeless 0 2 0  PHQ - 2 Score 0 3 0  Altered sleeping 0 2 -  Tired, decreased energy 1 3 -  Change in appetite 0 3 -  Feeling bad or failure about yourself  0 1 -  Trouble concentrating 1 0 -  Moving slowly or fidgety/restless 0 2 -  Suicidal thoughts 0 0 -  PHQ-9 Score 2 14 -  Difficult doing work/chores Somewhat difficult Somewhat difficult -    Today she reports itchy rash feet/hands.. Contractions: Not present. Vag. Bleeding: None.  Movement: Present. denies leaking of fluid. Review of Systems:   Pertinent items are noted in HPI Denies abnormal vaginal discharge w/ itching/odor/irritation, headaches, visual changes, shortness of breath, chest pain, abdominal pain, severe nausea/vomiting, or problems with urination or bowel movements unless otherwise stated above. Pertinent History Reviewed:  Reviewed past medical,surgical, social, obstetrical and family history.  Reviewed problem list, medications and allergies. Physical Assessment:   Vitals:   07/12/20 1624  BP: 117/71  Pulse: (!) 102  Weight: 226 lb 12.8 oz (102.9 kg)  Body mass index is 37.74 kg/m.        Physical Examination:   General appearance: Well appearing, and in no distress  Mental status: Alert, oriented to person, place, and time  Skin: Warm & dry  Cardiovascular: Normal heart rate noted  Respiratory: Normal respiratory effort, no distress  Abdomen: Soft, gravid, nontender, dependent edema lower abd  Pelvic: Cervical exam deferred         Extremities:  Edema: Deep pitting, indentation remains for a short time, no visible rash on hands. Has red blanchable dots in linear horizontal fashion across top of bilateral feet. Bilateral feet/ankles w/ 3+ edema, looks like it is from her crocs putting pressure on tops of feet.  Fetal Status: Fetal Heart Rate (bpm): 135 Fundal Height: 36 cm Movement: Present    Chaperone: n/a    Results for orders placed or performed in visit on 07/12/20 (from the past 24 hour(s))  POC Urinalysis Dipstick OB   Collection Time: 07/12/20  4:25 PM  Result Value Ref Range   Color, UA     Clarity, UA     Glucose, UA Negative Negative   Bilirubin, UA     Ketones, UA neg    Spec Grav, UA     Blood, UA 1+    pH, UA     POC,PROTEIN,UA Small (1+) Negative, Trace, Small (1+), Moderate (2+), Large (3+), 4+   Urobilinogen, UA     Nitrite, UA neg    Leukocytes, UA Negative Negative   Appearance     Odor      Assessment & Plan:  1) Low-risk pregnancy G1P0 at [redacted]w[redacted]d with an Estimated Date of Delivery: 08/11/20   2) 3+ bilateral LE edema, w/ compression petechia on tops of feet from her crocs. To get looser shoes, can use hydrocortisone cream for itching, elevate legs as much as possible   Meds: No orders of the defined types were placed in this encounter.  Labs/procedures today: none  Plan:  Continue routine obstetrical care  Next visit: prefers will be in person for gbs    Reviewed: Preterm labor symptoms and general obstetric precautions including but not limited to vaginal bleeding, contractions, leaking of fluid and fetal movement were reviewed in detail with the patient.  All questions were answered.   Follow-up: Return in about 1 week (around 07/19/2020) for LROB, in person, CNM.  Orders Placed This Encounter  Procedures  . POC Urinalysis Dipstick OB   Cheral Marker CNM, [facility] 07/12/2020 4:55 PM

## 2020-07-12 NOTE — Patient Instructions (Addendum)
Carolyn Ellis, I greatly value your feedback.  If you receive a survey following your visit with [place] today, we appreciate you taking the time to fill it out.  Thanks, Joellyn Haff, CNM, WHNP-BC  [facility] ([address] [place], [place] [address]) Entrance C, located off of [address] valet parking   Go to [email or web address] to register for FREE online childbirth classes   For itching:  . Avoid hot showers/baths, take cool/luke-warm showers/baths instead . Cool washcloths to itchy areas . Aquaphor or Eucerin creams to moisturize the skin . Hydrocortisone or Benadryl cream, or Benadryl by mouth for the itching . Oatmeal baths     Call the office [phone]) or go to [facility] if:  You begin to have strong, frequent contractions  Your water breaks.  Sometimes it is a big gush of fluid, sometimes it is just a trickle that keeps getting your panties wet or running down your legs  You have vaginal bleeding.  It is normal to have a small amount of spotting if your cervix was checked.   You don't feel your baby moving like normal.  If you don't, get you something to eat and drink and lay down and focus on feeling your baby move.  You should feel at least 10 movements in 2 hours.  If you don't, you should call the office or go to [facility].   Call the office [phone]) or go to [facility] hospital for these signs of pre-eclampsia:  Severe headache that does not go away with Tylenol  Visual changes- seeing spots, double, blurred vision  Pain under your right breast or upper abdomen that does not go away with Tums or heartburn medicine  Nausea and/or vomiting  Severe swelling in your hands, feet, and face    Home Blood Pressure Monitoring for Patients   Your provider has recommended that you check your blood pressure (BP) at least once a week at home. If you do not have a blood pressure cuff at home, one will be provided for you.  Contact your provider if you have not received your monitor within 1 week.   Helpful Tips for Accurate Home Blood Pressure Checks  . Don't smoke, exercise, or drink caffeine 30 minutes before checking your BP . Use the restroom before checking your BP (a full bladder can raise your pressure) . Relax in a comfortable upright chair . Feet on the ground . Left arm resting comfortably on a flat surface at the level of your heart . Legs uncrossed . Back supported . Sit quietly and don't talk . Place the cuff on your bare arm . Adjust snuggly, so that only two fingertips can fit between your skin and the top of the cuff . Check 2 readings separated by at least one minute . Keep a log of your BP readings . For a visual, please reference this diagram: http://ccnc.care/bpdiagram  Provider Name: [facility]     Phone: [phone]  Zone 1: ALL CLEAR  Continue to monitor your symptoms:  . BP reading is less than 140 (top number) or less than 90 (bottom number)  . No right upper stomach pain . No headaches or seeing spots . No feeling nauseated or throwing up . No swelling in face and hands  Zone 2: CAUTION Call your doctor's office for any of the following:  . BP reading is greater than 140 (top number) or greater than 90 (bottom number)  .  Stomach pain under your ribs in the middle or right side . Headaches or seeing spots . Feeling nauseated or throwing up . Swelling in face and hands  Zone 3: EMERGENCY  Seek immediate medical care if you have any of the following:  . BP reading is greater than160 (top number) or greater than 110 (bottom number) . Severe headaches not improving with Tylenol . Serious difficulty catching your breath . Any worsening symptoms from Zone 2  Preterm Labor and Birth Information  The normal length of a pregnancy is 39-41 weeks. Preterm labor is when labor starts before 37 completed weeks of pregnancy. What are the risk factors for preterm  labor? Preterm labor is more likely to occur in women who:  Have certain infections during pregnancy such as a bladder infection, sexually transmitted infection, or infection inside the uterus (chorioamnionitis).  Have a shorter-than-normal cervix.  Have gone into preterm labor before.  Have had surgery on their cervix.  Are younger than age 62 or older than age 28.  Are African American.  Are pregnant with twins or multiple babies (multiple gestation).  Take street drugs or smoke while pregnant.  Do not gain enough weight while pregnant.  Became pregnant shortly after having been pregnant. What are the symptoms of preterm labor? Symptoms of preterm labor include:  Cramps similar to those that can happen during a menstrual period. The cramps [place] happen with diarrhea.  Pain in the abdomen or lower back.  Regular uterine contractions that [place] feel like tightening of the abdomen.  A feeling of increased pressure in the pelvis.  Increased watery or bloody mucus discharge from the vagina.  Water breaking (ruptured amniotic sac). Why is it important to recognize signs of preterm labor? It is important to recognize signs of preterm labor because babies who are born prematurely [place] not be fully developed. This can put them at an increased risk for:  Long-term (chronic) heart and lung problems.  Difficulty immediately after birth with regulating body systems, including blood sugar, body temperature, heart rate, and breathing rate.  Bleeding in the brain.  Cerebral palsy.  Learning difficulties.  Death. These risks are highest for babies who are born before 34 weeks of pregnancy. How is preterm labor treated? Treatment depends on the length of your pregnancy, your condition, and the health of your baby. It [place] involve: 1. Having a stitch (suture) placed in your cervix to prevent your cervix from opening too early (cerclage). 2. Taking or being given medicines, such  as: ? Hormone medicines. These [place] be given early in pregnancy to help support the pregnancy. ? Medicine to stop contractions. ? Medicines to help mature the baby's lungs. These Bloch be prescribed if the risk of delivery is high. ? Medicines to prevent your baby from developing cerebral palsy. If the labor happens before 34 weeks of pregnancy, you Nero need to stay in the hospital. What should I do if I think I am in preterm labor? If you think that you are going into preterm labor, call your health care provider right away. How can I prevent preterm labor in future pregnancies? To increase your chance of having a full-term pregnancy:  Do not use any tobacco products, such as cigarettes, chewing tobacco, and e-cigarettes. If you need help quitting, ask your health care provider.  Do not use street drugs or medicines that have not been prescribed to you during your pregnancy.  Talk with your health care provider before taking any herbal supplements, even  if you have been taking them regularly.  Make sure you gain a healthy amount of weight during your pregnancy.  Watch for infection. If you think that you might have an infection, get it checked right away.  Make sure to tell your health care provider if you have gone into preterm labor before. This information is not intended to replace advice given to you by your health care provider. Make sure you discuss any questions you have with your health care provider. Document Revised: 02/06/2019 Document Reviewed: 03/07/2016 Elsevier Patient Education  2020 [employer].

## 2020-07-19 ENCOUNTER — Other Ambulatory Visit (HOSPITAL_COMMUNITY)
Admission: RE | Admit: 2020-07-19 | Discharge: 2020-07-19 | Disposition: A | Discharge status: Home / Self Care | Payer: 59 | Source: Ambulatory Visit | Attending: Obstetrics and Gynecology | Admitting: Obstetrics and Gynecology

## 2020-07-19 ENCOUNTER — Encounter: Payer: Self-pay | Admitting: Obstetrics and Gynecology

## 2020-07-19 ENCOUNTER — Ambulatory Visit (INDEPENDENT_AMBULATORY_CARE_PROVIDER_SITE_OTHER): Payer: 59 | Admitting: Obstetrics and Gynecology

## 2020-07-19 VITALS — BP 108/76 | HR 90 | Wt 230.0 lb

## 2020-07-19 DIAGNOSIS — Z3403 Encounter for supervision of normal first pregnancy, third trimester: Secondary | ICD-10-CM | POA: Insufficient documentation

## 2020-07-19 DIAGNOSIS — Z331 Pregnant state, incidental: Secondary | ICD-10-CM

## 2020-07-19 DIAGNOSIS — Z3A36 36 weeks gestation of pregnancy: Secondary | ICD-10-CM

## 2020-07-19 DIAGNOSIS — Z1389 Encounter for screening for other disorder: Secondary | ICD-10-CM

## 2020-07-19 LAB — POCT URINALYSIS DIPSTICK OB
Glucose, UA: NEGATIVE
Ketones, UA: NEGATIVE
Leukocytes, UA: NEGATIVE
Nitrite, UA: NEGATIVE

## 2020-07-19 MED ORDER — ONDANSETRON 4 MG PO TBDP: 4.0000 mg | ORAL_TABLET | Freq: Three times a day (TID) | ORAL | 2 refills | Status: DC | PRN

## 2020-07-19 NOTE — Patient Instructions (Signed)
[phone] is the phone number for Pregnancy Classes or hospital tours at [facility].   You will be referred to  [email or web address]   for more information on childbirth classes   At this site you Givler register for classes. You Aplin sign up for a waiting list if classes are full. Please SIGN UP FOR THIS!.   When the waiting list becomes long, sometimes new classes can be added.  [facility] at [facility] Call to Register: [phone] or [phone]   or   Register Online: [email or web address] THESE CLASSES FILL UP VERY QUICKLY, SO SIGN UP AS SOON AS YOU CAN!!! Please visit [facility] pregnancy website at [email or web address]  Childbirth Classes  Option 1: Birth & Baby Series ? Series of 3 weekly classes, on the same day of the week (can choose Mon-Thurs) from 6-9pm ? Helps you and your support person prepare for childbirth ? Reviews newborn care, labor & birth, cesarean birth, pain management, and comfort techniques ? Cost: $60 per couple for insured or self-pay, $30 per couple for Medicaid  Option 2: Weekend Birth & Baby ? This class is a weekend version of our Birth & Baby series.  It is designed for parents who have a difficult time fitting several weeks of classes into their schedule.   ? Covers the care of your newborn and the basics of labor and childbirth ? Friday 6:30pm-8:30pm Saturday 9am-4pm, includes lunch for you and your partner  ? Cost: $75 per couple for insured or self-pay, $30 per couple for Medicaid  Option 3: Natural Childbirth ? This series of 5 weekly classes is for expectant parents who want to learn and practice natural methods of coping with the process of labor and childbirth.  Can choose Mon or Tues, 7-9pm.   ? Covers relaxation, breathing, massage, visualization, role of the partner, and helpful positioning ? Participants learn how to be confident  in their body's ability to give birth. Class empowers and helps parents make informed decisions about care. Includes discussion that will help new parents transition into the immediate postpartum period.  ? Cost: $75 per couple for insured or self-pay, $30 per couple for Medicaid  Option 4: Online Birth & Baby ? This online class offers you the freedom to complete a Birth & Baby series in the comfort of your own home.  The flexibility of this option allows you to review sections at your own pace, at times convenient to you and your support people.  It includes additional video information, animations, quizzes and extended activities. Get organized with helpful eClass tools, checklists, and trackers.  ? Cost: $60 for 60 days of online access                                                                            Other Available Classes  Baby & Me Enjoy this time to discuss newborn & infant parenting topics and family adjustment issues with other new mothers in a relaxed environment. Each week brings a new speaker or baby-centered activity. We encourage mothers and their babies (birth to crawling) to join [place]. You are welcome to visit this group even if you haven't delivered yet! It's wonderful to make new friends early  and watch other moms interact with their babies. No registration or fee.  Big Brother/Big Sister Let your children share in the joy of a new brother or sister in this special class designed just for them. Discussion includes how families care for babies: swaddling, holding, diapering, safety, as well as how they can be helpful in their new role. This class is designed for children ages 2 to 48, but any age is welcome. Please register each child individually. $5 Breastfeeding Support Group This group is a mother-to-mother support circle where moms have the opportunity to share their breastfeeding experiences. A Breastfeeding Support nurse is present for questions and concerns. An infant  scale is available for weight checks. No fee or registration.  Breastfeeding Your Baby Breastfeeding is a special time for mother and child. This class will help you feel ready to begin this important relationship. Your partner is encouraged to attend with you. Learn what to expect and feel more confident in the first days of breastfeeding your newborn. This class also addresses the most common fears and challenges of breastfeeding during the first few weeks, months, and beyond. $30 per couple Caring for Baby This class is for expectant and adoptive parents who want to learn and practice the most up-to-date newborn care for their babies. Focus is on birth through first six weeks of life. Topics include feeding, bathing, diapering, crying, umbilical cord care, circumcision care and safe sleep. Parents learn how to recognize symptoms of illness and when to call the pediatrician. Register only the mom-to-be and your partner can plan to come with you. (*Note: This class is included in the Birth & Baby series and the Weekend Birth & Baby classes.) $10 per couple Comfort Techniques & Tour This 2-hour interactive class is designed for those who either do not wish to take the Birth & Baby series or for those who prefer our online childbirth class, but don't want to miss the opportunity to learn and practice hands-on techniques. These skills can help relieve some of the discomfort of labor and encourage your baby to rotate toward the best position for birth. You and your partner will be able to try a variety of labor positions with birth balls and rebozos as well as practice breathing, relaxation, and visual techniques. $20 per couple [employer] This course offers Dads-to-be the tools and knowledge needed to feel confident on their journey to becoming new fathers. Experienced dads, who have been trained as coaches, teach dads-to-be how to hold, comfort, diapers, swaddle and play with their infant while being  able to support the new mom as well. $25 Grandparent Love Expecting a grandbaby? Learn about the latest infant care and safety recommendations and ways to support your own child as he or she transitions into the parenting role. $10 per person Infant and Child CPR Parents, grandparents, babysitters, and friends learn Cardio-Pulmonary Resuscitation skills for infants and children. You will also learn how to treat both conscious and unconscious choking infants and children. Register each participant individually. (Note: This Family & Friends program does not offer certification.) $20 per person Marvelous Multiples Expecting twins, triplets, or more? This free 2-hour class covers the differences in labor, birth, parenting, and breastfeeding issues that face multiples' parents.  Maternity Care Center Virtual Tour  Online virtual tour of the new [place] [facility] at [facility] Talk This free mom-led group offers support and connection to mothers as they journey through the adjustments and struggles of that  sometimes overwhelming first year after the birth of a child. A member of our staff will be present to share resources and additional support if needed, as you care for yourself and baby. You are welcome to visit this group before you deliver! It's wonderful to meet new friends early and watch other moms interact with their babies.  Waterbirth Class Interested in a waterbirth? This free informational class will help you discover whether waterbirth is the right fit for you and is required if you are planning a waterbirth. Education about waterbirth itself, supplies you Markov need, and what you Leclere need from your support team is included in this class. Partners are encouraged to come.   Flavia Shipper Pickelsimer, I greatly value your feedback.  If you receive a survey following your visit with [place] today, we appreciate you taking the time to fill it out.  Thanks, Joellyn Haff, CNM, WHNP-BC    [facility] at [facility] ([address] [place], [place] [address]) Entrance C, located off of [address] valet parking  Go to [email or web address] to register for FREE online childbirth classes   Call the office [phone]) or go to [facility] if:  You begin to have strong, frequent contractions  Your water breaks.  Sometimes it is a big gush of fluid, sometimes it is just a trickle that keeps getting your panties wet or running down your legs  You have vaginal bleeding.  It is normal to have a small amount of spotting if your cervix was checked.   You don't feel your baby moving like normal.  If you don't, get you something to eat and drink and lay down and focus on feeling your baby move.  You should feel at least 10 movements in 2 hours.  If you don't, you should call the office or go to [facility].    Tdap Vaccine  It is recommended that you get the Tdap vaccine during the third trimester of EACH pregnancy to help protect your baby from getting pertussis (whooping cough)  27-36 weeks is the BEST time to do this so that you can pass the protection on to your baby. During pregnancy is better than after pregnancy, but if you are unable to get it during pregnancy it will be offered at the hospital.   You can get this vaccine with [place], at the health department, your family doctor, or some local pharmacies  Everyone who will be around your baby should also be up-to-date on their vaccines before the baby comes. Adults (who are not pregnant) only need 1 dose of Tdap during adulthood.   [place] Pediatricians/Family Doctors:  [facility] [phone]            [facility] [phone]                 [facility] [phone] (usually not accepting new patients unless you have family there already, you are always welcome to call and ask)       [facility] [phone]       [facility]  Pediatricians/Family Doctors:   [facility]: [phone]  [facility]: [phone]  [facility]: [phone]  [facility] Doctors:   [facility]: [phone]   [facility]: [phone]  [facility] Doctors:  [facility]: [phone]   Home Blood Pressure Monitoring for Patients   Your provider has recommended that you check your blood pressure (BP) at  least once a week at home. If you do not have a blood pressure cuff at home, one will be provided for you. Contact your provider if you have not received your monitor within 1 week.   Helpful Tips for Accurate Home Blood Pressure Checks   Don't smoke, exercise, or drink caffeine 30 minutes before checking your BP  Use the restroom before checking your BP (a full bladder can raise your pressure)  Relax in a comfortable upright chair  Feet on the ground  Left arm resting comfortably on a flat surface at the level of your heart  Legs uncrossed  Back supported  Sit quietly and don't talk  Place the cuff on your bare arm  Adjust snuggly, so that only two fingertips can fit between your skin and the top of the cuff  Check 2 readings separated by at least one minute  Keep a log of your BP readings  For a visual, please reference this diagram: http://ccnc.care/bpdiagram  Provider Name: [facility]     Phone: [phone]  Zone 1: ALL CLEAR  Continue to monitor your symptoms:   BP reading is less than 140 (top number) or less than 90 (bottom number)   No right upper stomach pain  No headaches or seeing spots  No feeling nauseated or throwing up  No swelling in face and hands  Zone 2: CAUTION Call your doctor's office for any of the following:   BP reading is greater than 140 (top number) or greater than 90 (bottom number)   Stomach pain under your ribs in the middle or right side  Headaches or  seeing spots  Feeling nauseated or throwing up  Swelling in face and hands  Zone 3: EMERGENCY  Seek immediate medical care if you have any of the following:   BP reading is greater than160 (top number) or greater than 110 (bottom number)  Severe headaches not improving with Tylenol  Serious difficulty catching your breath  Any worsening symptoms from Zone 2   Third Trimester of Pregnancy The third trimester is from week 29 through week 42, months 7 through 9. The third trimester is a time when the fetus is growing rapidly. At the end of the ninth month, the fetus is about 20 inches in length and weighs 6-10 pounds.  BODY CHANGES Your body goes through many changes during pregnancy. The changes vary from woman to woman.   Your weight will continue to increase. You can expect to gain 25-35 pounds (11-16 kg) by the end of the pregnancy.  You Kochel begin to get stretch marks on your hips, abdomen, and breasts.  You Rodriguez urinate more often because the fetus is moving lower into your pelvis and pressing on your bladder.  You Goodin develop or continue to have heartburn as a result of your pregnancy.  You Ellender develop constipation because certain hormones are causing the muscles that push waste through your intestines to slow down.  You Fager develop hemorrhoids or swollen, bulging veins (varicose veins).  You Mellott have pelvic pain because of the weight gain and pregnancy hormones relaxing your joints between the bones in your pelvis. Backaches Yawn result from overexertion of the muscles supporting your posture.  You Beckum have changes in your hair. These can include thickening of your hair, rapid growth, and changes in texture. Some women also have hair loss during or after pregnancy, or hair that feels dry or thin. Your hair will most likely return to normal after your  baby is born.  Your breasts will continue to grow and be tender. A yellow discharge Sgroi leak from your breasts called  colostrum.  Your belly button Spahr stick out.  You Inscoe feel short of breath because of your expanding uterus.  You Sandin notice the fetus "dropping," or moving lower in your abdomen.  You Chojnowski have a bloody mucus discharge. This usually occurs a few days to a week before labor begins.  Your cervix becomes thin and soft (effaced) near your due date. WHAT TO EXPECT AT YOUR PRENATAL EXAMS  You will have prenatal exams every 2 weeks until week 36. Then, you will have weekly prenatal exams. During a routine prenatal visit:  You will be weighed to make sure you and the fetus are growing normally.  Your blood pressure is taken.  Your abdomen will be measured to track your baby's growth.  The fetal heartbeat will be listened to.  Any test results from the previous visit will be discussed.  You Gironda have a cervical check near your due date to see if you have effaced. At around 36 weeks, your caregiver will check your cervix. At the same time, your caregiver will also perform a test on the secretions of the vaginal tissue. This test is to determine if a type of bacteria, Group B streptococcus, is present. Your caregiver will explain this further. Your caregiver Ninh ask you:  What your birth plan is.  How you are feeling.  If you are feeling the baby move.  If you have had any abnormal symptoms, such as leaking fluid, bleeding, severe headaches, or abdominal cramping.  If you have any questions. Other tests or screenings that Heine be performed during your third trimester include:  Blood tests that check for low iron levels (anemia).  Fetal testing to check the health, activity level, and growth of the fetus. Testing is done if you have certain medical conditions or if there are problems during the pregnancy. FALSE LABOR You Thon feel small, irregular contractions that eventually go away. These are called Braxton Hicks contractions, or false labor. Contractions Forge last for hours, days, or  even weeks before true labor sets in. If contractions come at regular intervals, intensify, or become painful, it is best to be seen by your caregiver.  SIGNS OF LABOR   Menstrual-like cramps.  Contractions that are 5 minutes apart or less.  Contractions that start on the top of the uterus and spread down to the lower abdomen and back.  A sense of increased pelvic pressure or back pain.  A watery or bloody mucus discharge that comes from the vagina. If you have any of these signs before the 37th week of pregnancy, call your caregiver right away. You need to go to the hospital to get checked immediately. HOME CARE INSTRUCTIONS   Avoid all smoking, herbs, alcohol, and unprescribed drugs. These chemicals affect the formation and growth of the baby.  Follow your caregiver's instructions regarding medicine use. There are medicines that are either safe or unsafe to take during pregnancy.  Exercise only as directed by your caregiver. Experiencing uterine cramps is a good sign to stop exercising.  Continue to eat regular, healthy meals.  Wear a good support bra for breast tenderness.  Do not use hot tubs, steam rooms, or saunas.  Wear your seat belt at all times when driving.  Avoid raw meat, uncooked cheese, cat litter boxes, and soil used by cats. These carry germs that can cause birth defects  in the baby.  Take your prenatal vitamins.  Try taking a stool softener (if your caregiver approves) if you develop constipation. Eat more high-fiber foods, such as fresh vegetables or fruit and whole grains. Drink plenty of fluids to keep your urine clear or pale yellow.  Take warm sitz baths to soothe any pain or discomfort caused by hemorrhoids. Use hemorrhoid cream if your caregiver approves.  If you develop varicose veins, wear support hose. Elevate your feet for 15 minutes, 3-4 times a day. Limit salt in your diet.  Avoid heavy lifting, wear low heal shoes, and practice good  posture.  Rest a lot with your legs elevated if you have leg cramps or low back pain.  Visit your dentist if you have not gone during your pregnancy. Use a soft toothbrush to brush your teeth and be gentle when you floss.  A sexual relationship Mackert be continued unless your caregiver directs you otherwise.  Do not travel far distances unless it is absolutely necessary and only with the approval of your caregiver.  Take prenatal classes to understand, practice, and ask questions about the labor and delivery.  Make a trial run to the hospital.  Pack your hospital bag.  Prepare the baby's nursery.  Continue to go to all your prenatal visits as directed by your caregiver. SEEK MEDICAL CARE IF:  You are unsure if you are in labor or if your water has broken.  You have dizziness.  You have mild pelvic cramps, pelvic pressure, or nagging pain in your abdominal area.  You have persistent nausea, vomiting, or diarrhea.  You have a bad smelling vaginal discharge.  You have pain with urination. SEEK IMMEDIATE MEDICAL CARE IF:   You have a fever.  You are leaking fluid from your vagina.  You have spotting or bleeding from your vagina.  You have severe abdominal cramping or pain.  You have rapid weight loss or gain.  You have shortness of breath with chest pain.  You notice sudden or extreme swelling of your face, hands, ankles, feet, or legs.  You have not felt your baby move in over an hour.  You have severe headaches that do not go away with medicine.  You have vision changes. Document Released: 10/09/2001 Document Revised: 10/20/2013 Document Reviewed: 12/16/2012 [facility] Patient Information 2015 [place], [place]. This information is not intended to replace advice given to you by your health care provider. Make sure you discuss any questions you have with your health care provider.

## 2020-07-19 NOTE — Progress Notes (Addendum)
PATIENT ID: Carolyn Ellis, female     DOB: 1996-07-16, 24 y.o.     MRN: [id number]    LOW-RISK PREGNANCY VISIT PATIENT NAME: Carolyn Ellis MRN [id number]  DOB: August 31, 1996  Chief Complaint:   No chief complaint on file.   History of Present Illness:   Carolyn Ellis is a 24 y.o. G1P0 female at [redacted]w[redacted]d with an Estimated Date of Delivery: 08/11/20 being seen today for ongoing management of a low-risk pregnancy.   Depression screen [facility] 2/9 05/10/2020 02/01/2020 12/09/2019  Decreased Interest 0 1 0  Down, Depressed, Hopeless 0 2 0  PHQ - 2 Score 0 3 0  Altered sleeping 0 2 -  Tired, decreased energy 1 3 -  Change in appetite 0 3 -  Feeling bad or failure about yourself  0 1 -  Trouble concentrating 1 0 -  Moving slowly or fidgety/restless 0 2 -  Suicidal thoughts 0 0 -  PHQ-9 Score 2 14 -  Difficult doing work/chores Somewhat difficult Somewhat difficult -    Today she reports no bleeding, no contractions and no leaking.  .  .   . denies leaking of fluid.  Review of Systems:   Pertinent items are noted in HPI Denies abnormal vaginal discharge w/ itching/odor/irritation, headaches, visual changes, shortness of breath, chest pain, abdominal pain, severe nausea/vomiting, or problems with urination or bowel movements unless otherwise stated above.  Pertinent History Reviewed:  Reviewed past medical,surgical, social, obstetrical and family history.  Reviewed problem list, medications and allergies.  Physical Assessment:  There were no vitals filed for this visit.There is no height or weight on file to calculate BMI.        Physical Examination:BP 108/76   Pulse 90   Wt 230 lb (104.3 kg)   LMP 10/30/2019   BMI 38.27 kg/m     General appearance: Well appearing, and in no distress  Mental status: Alert, oriented to person, place, and time  Skin: Warm & dry  Cardiovascular: Normal heart rate noted  Respiratory: Normal respiratory effort, no distress  Abdomen: Soft, gravid, nontender   Baby:  135   36 cm  Pelvic: Cervical exam performed         Extremities:    Fetal Status:          Chaperone: Audrey-Lott, Simone DO    No results found for this or any previous visit (from the past 24 hour(s)).  Assessment & Plan:  1) Low-risk pregnancy G1P0 at [redacted]w[redacted]d with an Estimated Date of Delivery: 08/11/20   2) , routine prenatal care   Meds: No orders of the defined types were placed in this encounter.  Labs/procedures today:  Lab Orders  No laboratory test(s) ordered today   Procedure Orders    No procedure(s) ordered today    Plan:  Continue routine obstetrical care , televisit next week. Next visit: prefers online   refil zofran x 9 tabs ref x 1 sent in Reviewed: Preterm labor symptoms and general obstetric precautions including but not limited to vaginal bleeding, contractions, leaking of fluid and fetal movement were reviewed in detail with the patient.  All questions were answered. Check bp weekly, let us know if >140/90.   Follow-up: No follow-ups on file. r   By signing my name below, I, [employer], attest that this documentation has been prepared under the direction and in the presence of Tilda Burrow, MD. Electronically Signed: Mal Misty Medical Scribe. 07/19/20. 11:35 AM.  I personally  performed the services described in this documentation, which was SCRIBED in my presence. The recorded information has been reviewed and considered accurate. It has been edited as necessary during review. Tilda Burrow, MD

## 2020-07-21 LAB — CERVICOVAGINAL ANCILLARY ONLY
Chlamydia: NEGATIVE
Comment: NEGATIVE
Comment: NORMAL
Neisseria Gonorrhea: NEGATIVE

## 2020-07-22 LAB — STREP GP B NAA+RFLX: Strep Gp B NAA+Rflx: NEGATIVE

## 2020-07-23 NOTE — Progress Notes (Signed)
Gbs negative

## 2020-07-27 ENCOUNTER — Ambulatory Visit (INDEPENDENT_AMBULATORY_CARE_PROVIDER_SITE_OTHER): Payer: 59 | Admitting: Advanced Practice Midwife

## 2020-07-27 VITALS — BP 121/75 | HR 83 | Wt 233.0 lb

## 2020-07-27 DIAGNOSIS — Z331 Pregnant state, incidental: Secondary | ICD-10-CM

## 2020-07-27 DIAGNOSIS — Z1389 Encounter for screening for other disorder: Secondary | ICD-10-CM

## 2020-07-27 DIAGNOSIS — Z3403 Encounter for supervision of normal first pregnancy, third trimester: Secondary | ICD-10-CM

## 2020-07-27 DIAGNOSIS — Z3A37 37 weeks gestation of pregnancy: Secondary | ICD-10-CM

## 2020-07-27 LAB — POCT URINALYSIS DIPSTICK OB
Blood, UA: NEGATIVE
Glucose, UA: NEGATIVE
Ketones, UA: NEGATIVE
Leukocytes, UA: NEGATIVE
Nitrite, UA: NEGATIVE

## 2020-07-27 NOTE — Patient Instructions (Signed)
Braxton Hicks Contractions °Contractions of the uterus can occur throughout pregnancy, but they are not always a sign that you are in labor. You Gladden have practice contractions called Braxton Hicks contractions. These false labor contractions are sometimes confused with true labor. °What are Braxton Hicks contractions? °Braxton Hicks contractions are tightening movements that occur in the muscles of the uterus before labor. Unlike true labor contractions, these contractions do not result in opening (dilation) and thinning of the cervix. Toward the end of pregnancy (32-34 weeks), Braxton Hicks contractions can happen more often and Bergemann become stronger. These contractions are sometimes difficult to tell apart from true labor because they can be very uncomfortable. You should not feel embarrassed if you go to the hospital with false labor. °Sometimes, the only way to tell if you are in true labor is for your health care provider to look for changes in the cervix. The health care provider will do a physical exam and Pelzer monitor your contractions. If you are not in true labor, the exam should show that your cervix is not dilating and your water has not broken. °If there are no other health problems associated with your pregnancy, it is completely safe for you to be sent home with false labor. You Lamphier continue to have Braxton Hicks contractions until you go into true labor. °How to tell the difference between true labor and false labor °True labor °· Contractions last 30-70 seconds. °· Contractions become very regular. °· Discomfort is usually felt in the top of the uterus, and it spreads to the lower abdomen and low back. °· Contractions do not go away with walking. °· Contractions usually become more intense and increase in frequency. °· The cervix dilates and gets thinner. °False labor °· Contractions are usually shorter and not as strong as true labor contractions. °· Contractions are usually irregular. °· Contractions  are often felt in the front of the lower abdomen and in the groin. °· Contractions Laumann go away when you walk around or change positions while lying down. °· Contractions get weaker and are shorter-lasting as time goes on. °· The cervix usually does not dilate or become thin. °Follow these instructions at home: ° °· Take over-the-counter and prescription medicines only as told by your health care provider. °· Keep up with your usual exercises and follow other instructions from your health care provider. °· Eat and drink lightly if you think you are going into labor. °· If Braxton Hicks contractions are making you uncomfortable: °? Change your position from lying down or resting to walking, or change from walking to resting. °? Sit and rest in a tub of warm water. °? Drink enough fluid to keep your urine pale yellow. Dehydration Ousley cause these contractions. °? Do slow and deep breathing several times an hour. °· Keep all follow-up prenatal visits as told by your health care provider. This is important. °Contact a health care provider if: °· You have a fever. °· You have continuous pain in your abdomen. °Get help right away if: °· Your contractions become stronger, more regular, and closer together. °· You have fluid leaking or gushing from your vagina. °· You pass blood-tinged mucus (bloody show). °· You have bleeding from your vagina. °· You have low back pain that you never had before. °· You feel your baby’s head pushing down and causing pelvic pressure. °· Your baby is not moving inside you as much as it used to. °Summary °· Contractions that occur before labor are   called Braxton Hicks contractions, false labor, or practice contractions. °· Braxton Hicks contractions are usually shorter, weaker, farther apart, and less regular than true labor contractions. True labor contractions usually become progressively stronger and regular, and they become more frequent. °· Manage discomfort from Braxton Hicks contractions  by changing position, resting in a warm bath, drinking plenty of water, or practicing deep breathing. °This information is not intended to replace advice given to you by your health care provider. Make sure you discuss any questions you have with your health care provider. °Document Revised: 09/27/2017 Document Reviewed: 02/28/2017 °Elsevier Patient Education © 2020 [employer] ° °

## 2020-07-27 NOTE — Progress Notes (Signed)
° °  LOW-RISK PREGNANCY VISIT Patient name: Carolyn Ellis MRN [id number]  Date of birth: [birth year] Chief Complaint:   Routine Prenatal Visit  History of Present Illness:   Carolyn Ellis is a 24 y.o. G1P0 female at [redacted]w[redacted]d with an Estimated Date of Delivery: 08/11/20 being seen today for ongoing management of a low-risk pregnancy.  Today she reports low abd discomfort at times. Contractions: Not present. Vag. Bleeding: None.  Movement: Present. denies leaking of fluid. Review of Systems:   Pertinent items are noted in HPI Denies abnormal vaginal discharge w/ itching/odor/irritation, headaches, visual changes, shortness of breath, chest pain, abdominal pain, severe nausea/vomiting, or problems with urination or bowel movements unless otherwise stated above. Pertinent History Reviewed:  Reviewed past medical,surgical, social, obstetrical and family history.  Reviewed problem list, medications and allergies. Physical Assessment:   Vitals:   07/27/20 1523  BP: 121/75  Pulse: 83  Weight: 233 lb (105.7 kg)  Body mass index is 38.77 kg/m.        Physical Examination:   General appearance: Well appearing, and in no distress  Mental status: Alert, oriented to person, place, and time  Skin: Warm & dry  Cardiovascular: Normal heart rate noted  Respiratory: Normal respiratory effort, no distress  Abdomen: Soft, gravid, nontender  Pelvic: Cervical exam deferred         Extremities: Edema: Moderate pitting, indentation subsides rapidly  Fetal Status: Fetal Heart Rate (bpm): 136 Fundal Height: 38 cm Movement: Present Presentation: Vertex  Results for orders placed or performed in visit on 07/27/20 (from the past 24 hour(s))  POC Urinalysis Dipstick OB   Collection Time: 07/27/20  3:27 PM  Result Value Ref Range   Color, UA     Clarity, UA     Glucose, UA Negative Negative   Bilirubin, UA     Ketones, UA neg    Spec Grav, UA     Blood, UA neg    pH, UA     POC,PROTEIN,UA Trace Negative,  Trace, Small (1+), Moderate (2+), Large (3+), 4+   Urobilinogen, UA     Nitrite, UA neg    Leukocytes, UA Negative Negative   Appearance     Odor      Assessment & Plan:  1) Low-risk pregnancy G1P0 at [redacted]w[redacted]d with an Estimated Date of Delivery: 08/11/20     Meds: No orders of the defined types were placed in this encounter.  Labs/procedures today: none  Plan:  Continue routine obstetrical care   Reviewed: Term labor symptoms and general obstetric precautions including but not limited to vaginal bleeding, contractions, leaking of fluid and fetal movement were reviewed in detail with the patient.  All questions were answered. Has home bp cuff. Check bp weekly, let us know if >140/90.   Follow-up: Return in about 1 week (around 08/03/2020) for LROB, in person.  Orders Placed This Encounter  Procedures   POC Urinalysis Dipstick OB   Arabella Merles [facility] 07/27/2020 3:41 PM

## 2020-08-03 ENCOUNTER — Inpatient Hospital Stay (HOSPITAL_COMMUNITY): Payer: 59 | Admitting: Anesthesiology

## 2020-08-03 ENCOUNTER — Other Ambulatory Visit: Payer: Self-pay

## 2020-08-03 ENCOUNTER — Inpatient Hospital Stay (HOSPITAL_COMMUNITY)
Admission: AD | Admit: 2020-08-03 | Discharge: 2020-08-06 | DRG: 786 | Disposition: A | Discharge status: Home / Self Care | Payer: 59 | Attending: Obstetrics & Gynecology | Admitting: Obstetrics & Gynecology

## 2020-08-03 ENCOUNTER — Encounter (HOSPITAL_COMMUNITY): Payer: Self-pay | Admitting: Obstetrics and Gynecology

## 2020-08-03 DIAGNOSIS — Z3A38 38 weeks gestation of pregnancy: Secondary | ICD-10-CM

## 2020-08-03 DIAGNOSIS — R87612 Low grade squamous intraepithelial lesion on cytologic smear of cervix (LGSIL): Secondary | ICD-10-CM | POA: Diagnosis present

## 2020-08-03 DIAGNOSIS — Z3403 Encounter for supervision of normal first pregnancy, third trimester: Secondary | ICD-10-CM

## 2020-08-03 DIAGNOSIS — O411230 Chorioamnionitis, third trimester, not applicable or unspecified: Secondary | ICD-10-CM | POA: Diagnosis present

## 2020-08-03 DIAGNOSIS — O26893 Other specified pregnancy related conditions, third trimester: Secondary | ICD-10-CM | POA: Diagnosis present

## 2020-08-03 DIAGNOSIS — Z20822 Contact with and (suspected) exposure to covid-19: Secondary | ICD-10-CM | POA: Diagnosis present

## 2020-08-03 DIAGNOSIS — O4292 Full-term premature rupture of membranes, unspecified as to length of time between rupture and onset of labor: Secondary | ICD-10-CM | POA: Diagnosis present

## 2020-08-03 LAB — CBC
HCT: 34.7 % — ABNORMAL LOW (ref 36.0–46.0)
Hemoglobin: 10.2 g/dL — ABNORMAL LOW (ref 12.0–15.0)
MCH: 23.9 pg — ABNORMAL LOW (ref 26.0–34.0)
MCHC: 29.4 g/dL — ABNORMAL LOW (ref 30.0–36.0)
MCV: 81.5 fL (ref 80.0–100.0)
Platelets: 226 10*3/uL (ref 150–400)
RBC: 4.26 MIL/uL (ref 3.87–5.11)
RDW: 16.9 % — ABNORMAL HIGH (ref 11.5–15.5)
WBC: 13.4 10*3/uL — ABNORMAL HIGH (ref 4.0–10.5)
nRBC: 0 % (ref 0.0–0.2)

## 2020-08-03 LAB — RESPIRATORY PANEL BY RT PCR (FLU A&B, COVID)
Influenza A by PCR: NEGATIVE
Influenza B by PCR: NEGATIVE
SARS Coronavirus 2 by RT PCR: NEGATIVE

## 2020-08-03 LAB — RPR: RPR Ser Ql: NONREACTIVE

## 2020-08-03 MED ORDER — FENTANYL-BUPIVACAINE-NACL 0.5-0.125-0.9 MG/250ML-% EP SOLN
12.0000 mL/h | EPIDURAL | Status: DC | PRN
  Administered 2020-08-04: 12 mL/h via EPIDURAL
  Filled 2020-08-03 (×2): qty 250

## 2020-08-03 MED ORDER — SOD CITRATE-CITRIC ACID 500-334 MG/5ML PO SOLN
30.0000 mL | ORAL | Status: DC | PRN
  Administered 2020-08-04: 30 mL via ORAL
  Filled 2020-08-03: qty 15

## 2020-08-03 MED ORDER — MISOPROSTOL 50MCG HALF TABLET
50.0000 ug | ORAL_TABLET | ORAL | Status: DC | PRN
  Administered 2020-08-03: 50 ug via ORAL

## 2020-08-03 MED ORDER — FENTANYL CITRATE (PF) 100 MCG/2ML IJ SOLN
50.0000 ug | INTRAMUSCULAR | Status: DC | PRN
  Administered 2020-08-03: 50 ug via INTRAVENOUS
  Filled 2020-08-03: qty 2

## 2020-08-03 MED ORDER — TERBUTALINE SULFATE 1 MG/ML IJ SOLN: 0.2500 mg | Freq: Once | INTRAMUSCULAR | Status: DC | PRN

## 2020-08-03 MED ORDER — MISOPROSTOL 50MCG HALF TABLET
50.0000 ug | ORAL_TABLET | ORAL | Status: DC
  Filled 2020-08-03: qty 1

## 2020-08-03 MED ORDER — FENTANYL CITRATE (PF) 2500 MCG/50ML IJ SOLN
INTRAMUSCULAR | Status: DC | PRN
Start: 2020-08-03 — End: 2020-08-04
  Administered 2020-08-03: 12 mL/h via EPIDURAL

## 2020-08-03 MED ORDER — PHENYLEPHRINE 40 MCG/ML (10ML) SYRINGE FOR IV PUSH (FOR BLOOD PRESSURE SUPPORT)
80.0000 ug | PREFILLED_SYRINGE | INTRAVENOUS | Status: DC | PRN
  Administered 2020-08-04: 80 ug via INTRAVENOUS

## 2020-08-03 MED ORDER — DIPHENHYDRAMINE HCL 50 MG/ML IJ SOLN: 12.5000 mg | INTRAMUSCULAR | Status: DC | PRN

## 2020-08-03 MED ORDER — EPHEDRINE 5 MG/ML INJ: 10.0000 mg | INTRAVENOUS | Status: DC | PRN

## 2020-08-03 MED ORDER — LIDOCAINE HCL (PF) 1 % IJ SOLN
INTRAMUSCULAR | Status: DC | PRN
  Administered 2020-08-03 (×2): 5 mL via EPIDURAL

## 2020-08-03 MED ORDER — OXYTOCIN-SODIUM CHLORIDE 30-0.9 UT/500ML-% IV SOLN
2.5000 [IU]/h | INTRAVENOUS | Status: DC
  Filled 2020-08-03: qty 500

## 2020-08-03 MED ORDER — MISOPROSTOL 100 MCG PO TABS: 25.0000 ug | ORAL_TABLET | ORAL | Status: DC | PRN

## 2020-08-03 MED ORDER — OXYTOCIN-SODIUM CHLORIDE 30-0.9 UT/500ML-% IV SOLN
1.0000 m[IU]/min | INTRAVENOUS | Status: DC
  Administered 2020-08-03: 2 m[IU]/min via INTRAVENOUS

## 2020-08-03 MED ORDER — LACTATED RINGERS IV SOLN: 500.0000 mL | Freq: Once | INTRAVENOUS | Status: DC

## 2020-08-03 MED ORDER — PHENYLEPHRINE 40 MCG/ML (10ML) SYRINGE FOR IV PUSH (FOR BLOOD PRESSURE SUPPORT)
80.0000 ug | PREFILLED_SYRINGE | INTRAVENOUS | Status: DC | PRN
  Filled 2020-08-03: qty 10

## 2020-08-03 MED ORDER — LACTATED RINGERS IV SOLN
500.0000 mL | INTRAVENOUS | Status: DC | PRN
  Administered 2020-08-04 (×2): 500 mL via INTRAVENOUS

## 2020-08-03 MED ORDER — ACETAMINOPHEN 325 MG PO TABS
650.0000 mg | ORAL_TABLET | ORAL | Status: DC | PRN
  Administered 2020-08-04: 650 mg via ORAL
  Filled 2020-08-03: qty 2

## 2020-08-03 MED ORDER — LIDOCAINE HCL (PF) 1 % IJ SOLN: 30.0000 mL | INTRAMUSCULAR | Status: DC | PRN

## 2020-08-03 MED ORDER — LACTATED RINGERS IV SOLN: INTRAVENOUS | Status: DC

## 2020-08-03 MED ORDER — OXYTOCIN BOLUS FROM INFUSION: 333.0000 mL | Freq: Once | INTRAVENOUS | Status: DC

## 2020-08-03 MED ORDER — ONDANSETRON HCL 4 MG/2ML IJ SOLN
4.0000 mg | Freq: Four times a day (QID) | INTRAMUSCULAR | Status: DC | PRN
  Administered 2020-08-03: 4 mg via INTRAVENOUS
  Filled 2020-08-03: qty 2

## 2020-08-03 NOTE — Progress Notes (Signed)
Labor Progress Note Carolyn Ellis is a 24 y.o. G1P0 at [redacted]w[redacted]d presented for labor management s/p SROM at 0200 on 08/03/20.  S: Doing well without complaints, comfortable with epidural.  O:  BP (!) 108/51   Pulse 83   Temp 98.7 F (37.1 C) (Oral)   Resp 18   Ht 5\' 5"  (1.651 m)   Wt 106 kg   LMP 10/30/2019   SpO2 99%   BMI 38.89 kg/m  EFM: baseline 150/moderate variability/intermittent variable and late decels with contractions Toco: q 3 minutes  CVE: Dilation: 6 Effacement (%): 90 Cervical Position: Posterior Station: 0 Presentation: Vertex Exam by:: Dr. [email or web address]   A&P: 24 y.o. G1P0 [redacted]w[redacted]d presented for labor management s/p SROM at 0200 on 08/03/20. #Labor: Progressing well. S/p cytotec x1 and started on pitocin at 1630. Has made change since last cervical exam, IUPC placed. Continue to titrate pitocin. #Pain: epidural in place #FWB: Category 2 strip secondary to intermittent variable decels but reassuringly moderate variability and good recovery between decels. #GBS negative  #Situational Anxiety/Depression: no safety concerns; plan for 1 week mood check, SW consult pp #LSIL on pap smear: plan for 1 year repeat pap  10/03/20, MD 11:44 PM

## 2020-08-03 NOTE — Progress Notes (Signed)
Labor Progress Note Carolyn Ellis is a 24 y.o. G1P0 at [redacted]w[redacted]d presented for labor management s/p SROM at 0200 on 08/03/20.  S: Per nursing, pt doing well s/p placement of epidural.  O:  BP 126/79   Pulse 94   Temp 98.8 F (37.1 C) (Oral)   Resp 18   Ht 5\' 5"  (1.651 m)   Wt 106 kg   LMP 10/30/2019   SpO2 99%   BMI 38.89 kg/m  EFM: baseline 140/moderate variability/intermittent variable decels  CVE: Dilation: 4 Effacement (%): 90 Cervical Position: Posterior Station: 0 Presentation: Vertex Exam by:: [email or web address] RNC   A&P: 24 y.o. G1P0 [redacted]w[redacted]d presented for labor management s/p SROM at 0200 on 08/03/20. #Labor: Progressing well. S/p cytotec x1 and started on pitocin at 1630. #Pain: epidural in place #FWB: Category 2 strip secondary to intermittent variable decels but reassuringly moderate variability and good recovery between decels. #GBS negative  #Situational Anxiety/Depression: no safety concerns; plan for 1 week mood check #LSIL on pap smear: plan for 1 year repeat pap  10/03/20, MD 7:22 PM

## 2020-08-03 NOTE — Anesthesia Procedure Notes (Signed)
Epidural Patient location during procedure: OB Start time: 08/03/2020 2:41 PM End time: 08/03/2020 2:51 PM  Staffing Anesthesiologist: Leonides Grills, MD Performed: anesthesiologist   Preanesthetic Checklist Completed: patient identified, IV checked, site marked, risks and benefits discussed, monitors and equipment checked, pre-op evaluation and timeout performed  Epidural Patient position: sitting Prep: DuraPrep Patient monitoring: heart rate, cardiac monitor, continuous pulse ox and blood pressure Approach: midline Location: L4-L5 Injection technique: LOR air  Needle:  Needle type: Tuohy  Needle gauge: 17 G Needle length: 9 cm Needle insertion depth: 6 cm Catheter type: closed end flexible Catheter size: 19 Gauge Catheter at skin depth: 11 cm Test dose: negative and Other  Assessment Events: blood not aspirated, injection not painful, no injection resistance and negative IV test  Additional Notes Informed consent obtained prior to proceeding including risk of failure, 1% risk of PDPH, risk of minor discomfort and bruising. Discussed alternatives to epidural analgesia and patient desires to proceed.  Timeout performed pre-procedure verifying patient name, procedure, and platelet count.  Patient tolerated procedure well. Reason for block:procedure for pain

## 2020-08-03 NOTE — Anesthesia Preprocedure Evaluation (Signed)
Anesthesia Evaluation  Patient identified by MRN, date of birth, ID band Patient awake    Reviewed: Allergy & Precautions, H&P , NPO status , Patient's Chart, lab work & pertinent test results  History of Anesthesia Complications Negative for: history of anesthetic complications  Airway Mallampati: II  TM Distance: >3 FB Neck ROM: full    Dental no notable dental hx. (+) Teeth Intact   Pulmonary neg pulmonary ROS,    Pulmonary exam normal breath sounds clear to auscultation       Cardiovascular negative cardio ROS Normal cardiovascular exam Rhythm:regular Rate:Normal     Neuro/Psych negative neurological ROS  negative psych ROS   GI/Hepatic negative GI ROS, Neg liver ROS,   Endo/Other  negative endocrine ROS  Renal/GU negative Renal ROS  negative genitourinary   Musculoskeletal   Abdominal (+) + obese,   Peds  Hematology  (+) Blood dyscrasia, anemia ,   Anesthesia Other Findings   Reproductive/Obstetrics (+) Pregnancy                             Anesthesia Physical Anesthesia Plan  ASA: II  Anesthesia Plan: Epidural   Post-op Pain Management:    Induction:   PONV Risk Score and Plan:   Airway Management Planned:   Additional Equipment:   Intra-op Plan:   Post-operative Plan:   Informed Consent: I have reviewed the patients History and Physical, chart, labs and discussed the procedure including the risks, benefits and alternatives for the proposed anesthesia with the patient or authorized representative who has indicated his/her understanding and acceptance.       Plan Discussed with:   Anesthesia Plan Comments:         Anesthesia Quick Evaluation  

## 2020-08-03 NOTE — Progress Notes (Addendum)
Labor Progress Note  Subjective:  Feeling some contractions. Finished breakfast   Objective:  BP (!) 118/55   Pulse 98   Temp 98.4 F (36.9 C) (Oral)   Resp 18   LMP 10/30/2019   SpO2 99%  Gen: Lying in bed comfortably. NAD.  Extremities: No signs of DVT.   CE: Dilation: 1 Effacement (%): Thick Cervical Position: Posterior Station: -3 Presentation: Vertex Exam by:: Dr. Dorice Lamas Contractions: irritable FH: BL 135, moderate var, + a, variable decels.   Assessment and Plan:  Carolyn Ellis is a 24 y.o. G1P0 at 107w6d - SROM @ 0200.  Labor: Augmentation of labor with cytotec @0930  oral.  Pain control: Epidural, IV pain meds and per patient Anticipated MOD: NSVD PPH Risk: augmented labor maternal obesity  Fetal Wellbeing: Cat 1 tracing GBS -- (09/22 1500)  Continuous fetal monitoring  11-24-1994, M.D.  FM PGY 3 08/03/2020 9:17 AM  Attestation of Supervision of Student:  I confirm that I have verified the information documented in the  resident's  note and that I have also personally reperformed the history, physical exam and all medical decision making activities.  I have verified that all services and findings are accurately documented in this student's note; and I agree with management and plan as outlined in the documentation. I have also made any necessary editorial changes.  10/03/2020, MD Center for [facility], [facility] 08/03/2020 2:32 PM

## 2020-08-03 NOTE — MAU Note (Signed)
...  Carolyn Ellis is a 24 y.o. at [redacted]w[redacted]d here in MAU reporting: her water broke at 0200. Pt reports clear pink tinged fluids. +FM. No VB. Pt reports she is not feeling any CTX.  GBS-  Pain score: 0/10

## 2020-08-03 NOTE — H&P (Addendum)
OBSTETRIC ADMISSION HISTORY AND PHYSICAL  Carolyn Ellis is a 24 y.o. female G1P0 with IUP at [redacted]w[redacted]d by 6 wk [place] presenting for SROM (10/6 @0200 ), clear fluid. She reports +FMs, no VB. She plans on bottle feeding. She requested depo for birth control. She received her prenatal care at [facility]   Dating: By 6 wk [facility] --->  Estimated Date of Delivery: 08/11/20  Sono: @[redacted]w[redacted]d , CWD, normal anatomy (LVEICF), cephalic presentation, 321g, 97% EFW  Prenatal History/Complications:  LSIL on pap smear (needs 1 year repeat pap) Situational Anxiety/depression regarding FOB leaving (not on meds)  Past Medical History: Past Medical History:  Diagnosis Date  . Abnormal Pap smear of cervix    ASCUS    Past Surgical History: Past Surgical History:  Procedure Laterality Date  . NO PAST SURGERIES      Obstetrical History: OB History    Gravida  1   Para      Term      Preterm      AB      Living        SAB      TAB      Ectopic      Multiple      Live Births              Social History Social History   Socioeconomic History  . Marital status: Single    Spouse name: Not on file  . Number of children: Not on file  . Years of education: Not on file  . Highest education level: Not on file  Occupational History  . Not on file  Tobacco Use  . Smoking status: Never Smoker  . Smokeless tobacco: Never Used  Vaping Use  . Vaping Use: Never used  Substance and Sexual Activity  . Alcohol use: No    Alcohol/week: 0.0 standard drinks  . Drug use: No  . Sexual activity: Yes    Birth control/protection: None  Other Topics Concern  . Not on file  Social History Narrative  . Not on file   Social Determinants of Health   Financial Resource Strain: Medium Risk  . Difficulty of Paying Living Expenses: Somewhat hard  Food Insecurity: No Food Insecurity  . Worried About 08/13/20 in the Last Year: Never true  . Ran Out of Food in the Last Year: Never true   Transportation Needs: No Transportation Needs  . Lack of Transportation (Medical): No  . Lack of Transportation (Non-Medical): No  Physical Activity: Sufficiently Active  . Days of Exercise per Week: 5 days  . Minutes of Exercise per Session: 40 min  Stress: No Stress Concern Present  . Feeling of Stress : Only a little  Social Connections: Socially Isolated  . Frequency of Communication with Friends and Family: More than three times a week  . Frequency of Social Gatherings with Friends and Family: Twice a week  . Attends Religious Services: Never  . Active Member of Clubs or Organizations: No  . Attends Meetings: Never  . Marital Status: Never married    Family History: Family History  Problem Relation Age of Onset  . Healthy Mother   . Multiple sclerosis Mother   . Heart attack Maternal Grandfather   . Stroke Paternal Grandfather   . Cancer Maternal Grandmother   . Hypertension Father   . Drug abuse Paternal Aunt     Allergies: Allergies  Allergen Reactions  . Augmentin [Amoxicillin-Pot Clavulanate]   .  Penicillins Rash    Medications Prior to Admission  Medication Sig Dispense Refill Last Dose  . ferrous sulfate 325 (65 FE) MG tablet Take 1 tablet (325 mg total) by mouth 2 (two) times daily with a meal. 60 tablet 3 08/02/2020 at Unknown time  . NON FORMULARY Prenatal gummie   08/02/2020 at Unknown time  . omeprazole (PRILOSEC) 10 MG capsule Take 10 mg by mouth daily.   Past Month at Unknown time  . ondansetron (ZOFRAN-ODT) 4 MG disintegrating tablet Take 1 tablet (4 mg total) by mouth every 8 (eight) hours as needed for nausea or vomiting. 9 tablet 2 08/02/2020 at Unknown time  . Blood Pressure Monitor MISC For regular home bp monitoring during pregnancy 1 each 0      Review of Systems   All systems reviewed and negative except as stated in HPI  Blood pressure 137/85, pulse 96, temperature 98.1 F (36.7 C), temperature source Oral, last  menstrual period 10/30/2019, SpO2 99 %. General appearance: alert, cooperative and no distress  Pulm: normal resp effort Abd: gravid Pelvic: as noted below Presentation: cephalic by BSUS Fetal monitoringBaseline: 130 bpm, Variability: Good {> 6 bpm), Accelerations: Reactive and Decelerations: Absent Uterine activity: q5 min    Prenatal labs: ABO, Rh: O/Positive/-- (04/05 1228) Antibody: Negative (07/13 0833) Rubella: 1.64 (04/05 1228) RPR: Non Reactive (07/13 0833)  HBsAg: Negative (04/05 1228)  HIV: Non Reactive (07/13 9381)  GBS: --Carolyn Ellis (09/22 1500)  2 hr Glucola: 82/145/93, passed Genetic screening: neg Anatomy US: normal except LV EICF  Prenatal Transfer Tool  Maternal Diabetes: No Genetic Screening: Normal Maternal Ultrasounds/Referrals: Normal and Isolated EIF (echogenic intracardiac focus) Fetal Ultrasounds or other Referrals:  None Maternal Substance Abuse:  No Significant Maternal Medications:  None Significant Maternal Lab Results: Group B Strep negative  No results found for this or any previous visit (from the past 24 hour(s)).  Patient Active Problem List   Diagnosis Date Noted  . Normal labor 08/03/2020  . LGSIL on Pap smear of cervix 02/04/2020  . Supervision of normal first pregnancy 01/28/2020    Assessment/Plan:  Carolyn Ellis is a 24 y.o. G1P0 at [redacted]w[redacted]d here for SROM (10/6 @0200 ).  #PROM: discussed r/b/a's of expectant management vs augmentation, wants expectant management at this point. Consider augmentation after patient eats. #Pain: plans to have epidural #anxiety/FOB leaving: SW consult postpartum #FWB: Category 1 #ID: neg #MOF: bottle #MOC: Depo #Circ: yes, outpt  MD, PGY-1  GME ATTESTATION:  I saw and evaluated the patient. I agree with the findings and the plan of care as documented in the resident's note.  Herby Abraham, MD OB Fellow, Faculty [facility], Center for [facility] 08/03/2020 7:36  AM

## 2020-08-04 ENCOUNTER — Encounter (HOSPITAL_COMMUNITY)
Admission: AD | Disposition: A | Discharge status: Home / Self Care | Payer: Self-pay | Source: Home / Self Care | Attending: Obstetrics & Gynecology

## 2020-08-04 ENCOUNTER — Encounter (HOSPITAL_COMMUNITY): Payer: Self-pay | Admitting: Obstetrics and Gynecology

## 2020-08-04 DIAGNOSIS — Z3A38 38 weeks gestation of pregnancy: Secondary | ICD-10-CM

## 2020-08-04 LAB — COMPREHENSIVE METABOLIC PANEL
ALT: 11 U/L (ref 0–44)
AST: 21 U/L (ref 15–41)
Albumin: 1.9 g/dL — ABNORMAL LOW (ref 3.5–5.0)
Alkaline Phosphatase: 138 U/L — ABNORMAL HIGH (ref 38–126)
Anion gap: 12 (ref 5–15)
BUN: 7 mg/dL (ref 6–20)
CO2: 18 mmol/L — ABNORMAL LOW (ref 22–32)
Calcium: 8.3 mg/dL — ABNORMAL LOW (ref 8.9–10.3)
Chloride: 105 mmol/L (ref 98–111)
Creatinine, Ser: 0.95 mg/dL (ref 0.44–1.00)
GFR calc non Af Amer: >60 mL/min (ref >60)
Glucose, Bld: 106 mg/dL — ABNORMAL HIGH (ref 70–99)
Potassium: 3.3 mmol/L — ABNORMAL LOW (ref 3.5–5.1)
Sodium: 135 mmol/L (ref 135–145)
Total Bilirubin: 0.9 mg/dL (ref 0.3–1.2)
Total Protein: 5.3 g/dL — ABNORMAL LOW (ref 6.5–8.1)

## 2020-08-04 LAB — CBC
HCT: 30.6 % — ABNORMAL LOW (ref 36.0–46.0)
Hemoglobin: 9.4 g/dL — ABNORMAL LOW (ref 12.0–15.0)
MCH: 24.3 pg — ABNORMAL LOW (ref 26.0–34.0)
MCHC: 30.7 g/dL (ref 30.0–36.0)
MCV: 79.1 fL — ABNORMAL LOW (ref 80.0–100.0)
Platelets: 195 10*3/uL (ref 150–400)
RBC: 3.87 MIL/uL (ref 3.87–5.11)
RDW: 17.2 % — ABNORMAL HIGH (ref 11.5–15.5)
WBC: 27.5 10*3/uL — ABNORMAL HIGH (ref 4.0–10.5)
nRBC: 0 % (ref 0.0–0.2)

## 2020-08-04 LAB — PROTEIN / CREATININE RATIO, URINE
Creatinine, Urine: 15.69 mg/dL
Protein Creatinine Ratio: 1.72 mg/mg{Cre} — ABNORMAL HIGH (ref 0.00–0.15)
Total Protein, Urine: 27 mg/dL

## 2020-08-04 SURGERY — Surgical Case
Anesthesia: Epidural

## 2020-08-04 MED ORDER — OXYCODONE HCL 5 MG PO TABS: 5.0000 mg | ORAL_TABLET | ORAL | Status: DC | PRN

## 2020-08-04 MED ORDER — OXYTOCIN-SODIUM CHLORIDE 30-0.9 UT/500ML-% IV SOLN
INTRAVENOUS | Status: DC | PRN
  Administered 2020-08-04 (×2): 200 mL via INTRAVENOUS
  Administered 2020-08-04: 100 mL via INTRAVENOUS

## 2020-08-04 MED ORDER — SIMETHICONE 80 MG PO CHEW
80.0000 mg | CHEWABLE_TABLET | ORAL | Status: DC
  Administered 2020-08-04 – 2020-08-05 (×2): 80 mg via ORAL
  Filled 2020-08-04 (×2): qty 1

## 2020-08-04 MED ORDER — OXYTOCIN-SODIUM CHLORIDE 30-0.9 UT/500ML-% IV SOLN
INTRAVENOUS | Status: AC
  Filled 2020-08-04: qty 500

## 2020-08-04 MED ORDER — ACETAMINOPHEN 500 MG PO TABS: 1000.0000 mg | ORAL_TABLET | Freq: Four times a day (QID) | ORAL | Status: DC

## 2020-08-04 MED ORDER — ONDANSETRON HCL 4 MG/2ML IJ SOLN: 4.0000 mg | Freq: Three times a day (TID) | INTRAMUSCULAR | Status: DC | PRN

## 2020-08-04 MED ORDER — LACTATED RINGERS IV SOLN: INTRAVENOUS | Status: DC

## 2020-08-04 MED ORDER — STERILE WATER FOR IRRIGATION IR SOLN
Status: DC | PRN
  Administered 2020-08-04: 1000 mL

## 2020-08-04 MED ORDER — NALOXONE HCL 4 MG/10ML IJ SOLN
1.0000 ug/kg/h | INTRAVENOUS | Status: DC | PRN
  Filled 2020-08-04: qty 5

## 2020-08-04 MED ORDER — FENTANYL CITRATE (PF) 100 MCG/2ML IJ SOLN
INTRAMUSCULAR | Status: AC
  Filled 2020-08-04: qty 2

## 2020-08-04 MED ORDER — DIPHENHYDRAMINE HCL 25 MG PO CAPS: 25.0000 mg | ORAL_CAPSULE | ORAL | Status: DC | PRN

## 2020-08-04 MED ORDER — METHYLERGONOVINE MALEATE 0.2 MG/ML IJ SOLN
INTRAMUSCULAR | Status: AC
  Filled 2020-08-04: qty 1

## 2020-08-04 MED ORDER — SODIUM CHLORIDE 0.9 % IV SOLN
500.0000 mg | INTRAVENOUS | Status: AC
  Administered 2020-08-04: 500 mg via INTRAVENOUS

## 2020-08-04 MED ORDER — NALBUPHINE HCL 10 MG/ML IJ SOLN: 5.0000 mg | Freq: Once | INTRAMUSCULAR | Status: DC | PRN

## 2020-08-04 MED ORDER — SENNOSIDES-DOCUSATE SODIUM 8.6-50 MG PO TABS
2.0000 | ORAL_TABLET | ORAL | Status: DC
  Administered 2020-08-04 – 2020-08-05 (×2): 2 via ORAL
  Filled 2020-08-04 (×2): qty 2

## 2020-08-04 MED ORDER — SODIUM CHLORIDE 0.9 % IV SOLN
3.0000 g | Freq: Four times a day (QID) | INTRAVENOUS | Status: AC
  Administered 2020-08-04 – 2020-08-05 (×4): 3 g via INTRAVENOUS
  Filled 2020-08-04 (×2): qty 3
  Filled 2020-08-04: qty 8
  Filled 2020-08-04 (×2): qty 3

## 2020-08-04 MED ORDER — NALBUPHINE HCL 10 MG/ML IJ SOLN: 5.0000 mg | INTRAMUSCULAR | Status: DC | PRN

## 2020-08-04 MED ORDER — GENTAMICIN SULFATE 40 MG/ML IJ SOLN
5.0000 mg/kg | INTRAVENOUS | Status: DC
  Administered 2020-08-04: 380 mg via INTRAVENOUS
  Filled 2020-08-04: qty 9.5

## 2020-08-04 MED ORDER — METHYLERGONOVINE MALEATE 0.2 MG/ML IJ SOLN
INTRAMUSCULAR | Status: DC | PRN
  Administered 2020-08-04: .2 mg via INTRAMUSCULAR

## 2020-08-04 MED ORDER — DEXAMETHASONE SODIUM PHOSPHATE 10 MG/ML IJ SOLN: INTRAMUSCULAR | Status: DC | PRN

## 2020-08-04 MED ORDER — SIMETHICONE 80 MG PO CHEW: 80.0000 mg | CHEWABLE_TABLET | ORAL | Status: DC | PRN

## 2020-08-04 MED ORDER — TETANUS-DIPHTH-ACELL PERTUSSIS 5-2.5-18.5 LF-MCG/0.5 IM SUSP: 0.5000 mL | Freq: Once | INTRAMUSCULAR | Status: DC

## 2020-08-04 MED ORDER — LACTATED RINGERS IV SOLN: INTRAVENOUS | Status: DC | PRN

## 2020-08-04 MED ORDER — ACETAMINOPHEN 500 MG PO TABS
1000.0000 mg | ORAL_TABLET | Freq: Three times a day (TID) | ORAL | Status: DC
  Administered 2020-08-04 – 2020-08-06 (×6): 1000 mg via ORAL
  Filled 2020-08-04 (×6): qty 2

## 2020-08-04 MED ORDER — ZOLPIDEM TARTRATE 5 MG PO TABS: 5.0000 mg | ORAL_TABLET | Freq: Every evening | ORAL | Status: DC | PRN

## 2020-08-04 MED ORDER — SODIUM CHLORIDE 0.9% FLUSH: 3.0000 mL | INTRAVENOUS | Status: DC | PRN

## 2020-08-04 MED ORDER — DEXAMETHASONE SODIUM PHOSPHATE 10 MG/ML IJ SOLN
INTRAMUSCULAR | Status: DC | PRN
  Administered 2020-08-04: 10 mg via INTRAVENOUS

## 2020-08-04 MED ORDER — PHENYLEPHRINE HCL-NACL 20-0.9 MG/250ML-% IV SOLN
INTRAVENOUS | Status: AC
  Filled 2020-08-04: qty 250

## 2020-08-04 MED ORDER — TRANEXAMIC ACID-NACL 1000-0.7 MG/100ML-% IV SOLN
INTRAVENOUS | Status: DC | PRN
  Administered 2020-08-04: 1000 mg via INTRAVENOUS

## 2020-08-04 MED ORDER — COCONUT OIL OIL: 1.0000 "application " | TOPICAL_OIL | Status: DC | PRN

## 2020-08-04 MED ORDER — SIMETHICONE 80 MG PO CHEW
80.0000 mg | CHEWABLE_TABLET | Freq: Three times a day (TID) | ORAL | Status: DC
  Administered 2020-08-05 – 2020-08-06 (×5): 80 mg via ORAL
  Filled 2020-08-04 (×5): qty 1

## 2020-08-04 MED ORDER — NALOXONE HCL 0.4 MG/ML IJ SOLN: 0.4000 mg | INTRAMUSCULAR | Status: DC | PRN

## 2020-08-04 MED ORDER — MORPHINE SULFATE (PF) 0.5 MG/ML IJ SOLN
INTRAMUSCULAR | Status: DC | PRN
Start: 2020-08-04 — End: 2020-08-04
  Administered 2020-08-04: 3 mg via EPIDURAL

## 2020-08-04 MED ORDER — MENTHOL 3 MG MT LOZG: 1.0000 | LOZENGE | OROMUCOSAL | Status: DC | PRN

## 2020-08-04 MED ORDER — TRANEXAMIC ACID-NACL 1000-0.7 MG/100ML-% IV SOLN
INTRAVENOUS | Status: AC
  Filled 2020-08-04: qty 100

## 2020-08-04 MED ORDER — SCOPOLAMINE 1 MG/3DAYS TD PT72: 1.0000 | MEDICATED_PATCH | Freq: Once | TRANSDERMAL | Status: DC

## 2020-08-04 MED ORDER — KETOROLAC TROMETHAMINE 30 MG/ML IJ SOLN: 30.0000 mg | Freq: Four times a day (QID) | INTRAMUSCULAR | Status: AC | PRN

## 2020-08-04 MED ORDER — WITCH HAZEL-GLYCERIN EX PADS: 1.0000 "application " | MEDICATED_PAD | CUTANEOUS | Status: DC | PRN

## 2020-08-04 MED ORDER — DEXAMETHASONE SODIUM PHOSPHATE 4 MG/ML IJ SOLN
INTRAMUSCULAR | Status: AC
  Filled 2020-08-04: qty 1

## 2020-08-04 MED ORDER — BUPIVACAINE HCL (PF) 0.5 % IJ SOLN
INTRAMUSCULAR | Status: DC | PRN
  Administered 2020-08-04: 30 mL

## 2020-08-04 MED ORDER — FENTANYL CITRATE (PF) 100 MCG/2ML IJ SOLN: 25.0000 ug | INTRAMUSCULAR | Status: DC | PRN

## 2020-08-04 MED ORDER — DIPHENHYDRAMINE HCL 50 MG/ML IJ SOLN: 12.5000 mg | INTRAMUSCULAR | Status: DC | PRN

## 2020-08-04 MED ORDER — IBUPROFEN 800 MG PO TABS
800.0000 mg | ORAL_TABLET | Freq: Three times a day (TID) | ORAL | Status: DC
  Administered 2020-08-04 – 2020-08-06 (×6): 800 mg via ORAL
  Filled 2020-08-04 (×6): qty 1

## 2020-08-04 MED ORDER — SODIUM CHLORIDE 0.9 % IR SOLN
Status: DC | PRN
  Administered 2020-08-04: 1

## 2020-08-04 MED ORDER — KETOROLAC TROMETHAMINE 30 MG/ML IJ SOLN
INTRAMUSCULAR | Status: AC
  Filled 2020-08-04: qty 1

## 2020-08-04 MED ORDER — MORPHINE SULFATE (PF) 0.5 MG/ML IJ SOLN
INTRAMUSCULAR | Status: AC
Start: 2020-08-04 — End: ?
  Filled 2020-08-04: qty 10

## 2020-08-04 MED ORDER — LIDOCAINE HCL (PF) 2 % IJ SOLN
INTRAMUSCULAR | Status: AC
  Filled 2020-08-04: qty 15

## 2020-08-04 MED ORDER — DIBUCAINE (PERIANAL) 1 % EX OINT: 1.0000 "application " | TOPICAL_OINTMENT | CUTANEOUS | Status: DC | PRN

## 2020-08-04 MED ORDER — GABAPENTIN 100 MG PO CAPS
300.0000 mg | ORAL_CAPSULE | Freq: Two times a day (BID) | ORAL | Status: DC
  Administered 2020-08-04 – 2020-08-06 (×4): 300 mg via ORAL
  Filled 2020-08-04 (×4): qty 3

## 2020-08-04 MED ORDER — LACTATED RINGERS AMNIOINFUSION: INTRAVENOUS | Status: DC

## 2020-08-04 MED ORDER — ACETAMINOPHEN 10 MG/ML IV SOLN: 1000.0000 mg | Freq: Once | INTRAVENOUS | Status: DC | PRN

## 2020-08-04 MED ORDER — PHENYLEPHRINE 40 MCG/ML (10ML) SYRINGE FOR IV PUSH (FOR BLOOD PRESSURE SUPPORT)
PREFILLED_SYRINGE | INTRAVENOUS | Status: AC
  Filled 2020-08-04: qty 20

## 2020-08-04 MED ORDER — ENOXAPARIN SODIUM 60 MG/0.6ML ~~LOC~~ SOLN
50.0000 mg | SUBCUTANEOUS | Status: DC
  Administered 2020-08-05 – 2020-08-06 (×2): 50 mg via SUBCUTANEOUS
  Filled 2020-08-04 (×2): qty 0.6

## 2020-08-04 MED ORDER — KETOROLAC TROMETHAMINE 30 MG/ML IJ SOLN
30.0000 mg | Freq: Once | INTRAMUSCULAR | Status: AC | PRN
  Administered 2020-08-04: 30 mg via INTRAVENOUS

## 2020-08-04 MED ORDER — ONDANSETRON HCL 4 MG/2ML IJ SOLN
INTRAMUSCULAR | Status: DC | PRN
  Administered 2020-08-04: 4 mg via INTRAVENOUS

## 2020-08-04 MED ORDER — SODIUM CHLORIDE 0.9 % IV SOLN
2.0000 g | Freq: Four times a day (QID) | INTRAVENOUS | Status: DC
  Administered 2020-08-04: 2 g via INTRAVENOUS
  Filled 2020-08-04: qty 2000

## 2020-08-04 MED ORDER — ONDANSETRON HCL 4 MG/2ML IJ SOLN
INTRAMUSCULAR | Status: AC
  Filled 2020-08-04: qty 2

## 2020-08-04 MED ORDER — MEASLES, MUMPS & RUBELLA VAC IJ SOLR: 0.5000 mL | Freq: Once | INTRAMUSCULAR | Status: DC

## 2020-08-04 MED ORDER — DIPHENHYDRAMINE HCL 25 MG PO CAPS: 25.0000 mg | ORAL_CAPSULE | Freq: Four times a day (QID) | ORAL | Status: DC | PRN

## 2020-08-04 MED ORDER — PRENATAL MULTIVITAMIN CH
1.0000 | ORAL_TABLET | Freq: Every day | ORAL | Status: DC
  Administered 2020-08-05 – 2020-08-06 (×2): 1 via ORAL
  Filled 2020-08-04 (×2): qty 1

## 2020-08-04 MED ORDER — OXYTOCIN-SODIUM CHLORIDE 30-0.9 UT/500ML-% IV SOLN: 2.5000 [IU]/h | INTRAVENOUS | Status: AC

## 2020-08-04 MED ORDER — LIDOCAINE-EPINEPHRINE (PF) 2 %-1:200000 IJ SOLN
INTRAMUSCULAR | Status: DC | PRN
  Administered 2020-08-04 (×3): 5 mL via EPIDURAL

## 2020-08-04 SURGICAL SUPPLY — 36 items
APL SKNCLS STERI-STRIP NONHPOA (GAUZE/BANDAGES/DRESSINGS) ×1
BARRIER ADHS 3X4 INTERCEED (GAUZE/BANDAGES/DRESSINGS) IMPLANT
BENZOIN TINCTURE PRP APPL 2/3 (GAUZE/BANDAGES/DRESSINGS) ×2 IMPLANT
BRR ADH 4X3 ABS CNTRL BYND (GAUZE/BANDAGES/DRESSINGS)
CHLORAPREP W/TINT 26ML (MISCELLANEOUS) ×3 IMPLANT
CLAMP CORD UMBIL (MISCELLANEOUS) IMPLANT
CLOSURE STERI-STRIP 1/2X4 (GAUZE/BANDAGES/DRESSINGS) ×1
CLOTH BEACON ORANGE TIMEOUT ST (SAFETY) ×3 IMPLANT
CLSR STERI-STRIP ANTIMIC 1/2X4 (GAUZE/BANDAGES/DRESSINGS) ×1 IMPLANT
DRSG OPSITE POSTOP 4X10 (GAUZE/BANDAGES/DRESSINGS) ×3 IMPLANT
ELECT REM PT RETURN 9FT ADLT (ELECTROSURGICAL) ×3
ELECTRODE REM PT RTRN 9FT ADLT (ELECTROSURGICAL) ×1 IMPLANT
EXTRACTOR VACUUM KIWI (MISCELLANEOUS) IMPLANT
GLOVE BIO SURGEON STRL SZ 6.5 (GLOVE) ×2 IMPLANT
GLOVE BIO SURGEONS STRL SZ 6.5 (GLOVE) ×1
GLOVE BIOGEL PI IND STRL 7.0 (GLOVE) ×2 IMPLANT
GLOVE BIOGEL PI INDICATOR 7.0 (GLOVE) ×4
GOWN STRL REUS W/TWL LRG LVL3 (GOWN DISPOSABLE) ×6 IMPLANT
KIT ABG SYR 3ML LUER SLIP (SYRINGE) IMPLANT
NDL HYPO 25X5/8 SAFETYGLIDE (NEEDLE) IMPLANT
NEEDLE HYPO 22GX1.5 SAFETY (NEEDLE) IMPLANT
NEEDLE HYPO 25X5/8 SAFETYGLIDE (NEEDLE) IMPLANT
NS IRRIG 1000ML POUR BTL (IV SOLUTION) ×3 IMPLANT
PACK C SECTION WH (CUSTOM PROCEDURE TRAY) ×3 IMPLANT
PAD OB MATERNITY 4.3X12.25 (PERSONAL CARE ITEMS) ×3 IMPLANT
PENCIL SMOKE EVAC W/HOLSTER (ELECTROSURGICAL) ×3 IMPLANT
RETRACTOR WND ALEXIS 25 LRG (MISCELLANEOUS) IMPLANT
RTRCTR WOUND ALEXIS 25CM LRG (MISCELLANEOUS)
SUT VIC AB 0 CT1 36 (SUTURE) ×18 IMPLANT
SUT VIC AB 2-0 CT1 27 (SUTURE) ×3
SUT VIC AB 2-0 CT1 TAPERPNT 27 (SUTURE) ×1 IMPLANT
SUT VIC AB 4-0 PS2 27 (SUTURE) ×3 IMPLANT
SYR CONTROL 10ML LL (SYRINGE) IMPLANT
TOWEL OR 17X24 6PK STRL BLUE (TOWEL DISPOSABLE) ×3 IMPLANT
TRAY FOLEY W/BAG SLVR 14FR LF (SET/KITS/TRAYS/PACK) IMPLANT
WATER STERILE IRR 1000ML POUR (IV SOLUTION) ×3 IMPLANT

## 2020-08-04 NOTE — Anesthesia Postprocedure Evaluation (Signed)
Anesthesia Post Note  Patient: Johann Gascoigne Deleo  Procedure(s) Performed: CESAREAN SECTION     Patient location during evaluation: PACU Anesthesia Type: Epidural Level of consciousness: oriented and awake and alert Pain management: pain level controlled Vital Signs Assessment: post-procedure vital signs reviewed and stable Respiratory status: spontaneous breathing, respiratory function stable and patient connected to nasal cannula oxygen Cardiovascular status: blood pressure returned to baseline and stable Postop Assessment: no headache, no backache, no apparent nausea or vomiting and epidural receding Anesthetic complications: no   No complications documented.  Last Vitals:  Vitals:   08/04/20 1745 08/04/20 1800  BP: 127/74 127/76  Pulse: 90 90  Resp: (!) 21 (!) 27  Temp: 37.6 C   SpO2: 92% 93%    Last Pain:  Vitals:   08/04/20 1745  TempSrc: Axillary  PainSc: 7    Pain Goal:                Epidural/Spinal Function Cutaneous sensation: Able to Wiggle Toes (08/04/20 1800), Patient able to flex knees: No (08/04/20 1800), Patient able to lift hips off bed: No (08/04/20 1800), Back pain beyond tenderness at insertion site: No (08/04/20 1800), Progressively worsening motor and/or sensory loss: No (08/04/20 1800), Bowel and/or bladder incontinence post epidural: No (08/04/20 1800)  Christia Coaxum L Coulson Wehner

## 2020-08-04 NOTE — Progress Notes (Signed)
Labor Progress Note Carolyn Ellis is a 24 y.o. G1P0 at [redacted]w[redacted]d presented for labor management s/p SROM at 0200 on 08/03/20.  S: Doing well without complaints, comfortable with epidural.  O:  BP 114/79   Pulse 91   Temp 99 F (37.2 C) (Oral)   Resp 17   Ht 5\' 5"  (1.651 m)   Wt 106 kg   LMP 10/30/2019   SpO2 98%   BMI 38.89 kg/m  EFM: baseline 140-150/moderate variability/intermittent variable and late decels with contractions Toco: q 1-3 minutes  CVE: Dilation: 9 Effacement (%): 90 Cervical Position: Posterior Station: 0 Presentation: Vertex Exam by:: C Cornetto RN   A&P: 24 y.o. G1P0 [redacted]w[redacted]d presented for labor management s/p SROM at 0200 on 08/03/20. #Labor: Progressing well. S/p cytotec x1. Currently on pitocin. Has made change since last cervical exam, continue to titrate pitocin. Given recurrent late and variable decels, Dr. 10/03/20 called and decision made to given phenylephrine and amnioinfusion at this time. Will also rebolus LR 500cc. Continue to monitor. #Pain: epidural in place #FWB: Category 2 strip secondary to intermittent variable decels but reassuringly moderate variability and good recovery between decels. #GBS negative  #Situational Anxiety/Depression: no safety concerns; plan for 1 week mood check, SW consult pp #LSIL on pap smear: plan for 1 year repeat pap  Despina Hidden, MD 5:19 AM

## 2020-08-04 NOTE — Progress Notes (Signed)
Labor Progress Note Carolyn Ellis is a 24 y.o. G1P0 at [redacted]w[redacted]d presented for labor management s/p SROM at 0200 on 08/03/20.  S: Pt doing well with pushing. Minimal discomfort with contractions. Endorsing vaginal pressure with contractions.  O:  BP (!) 143/70   Pulse 96   Temp 99.8 F (37.7 C) (Oral)   Resp 20   Ht 5\' 5"  (1.651 m)   Wt 106 kg   LMP 10/30/2019   SpO2 96%   BMI 38.89 kg/m  EFM: baseline 165/moderate variability/variable decels with contractions Toco: q 2-3 minutes  CVE: Dilation: 10 Dilation Complete Date: 08/04/20 Dilation Complete Time: 0915 Effacement (%): 100 Cervical Position: Posterior Station: Plus 1 Presentation: Vertex Exam by:: [email or web address], RN   A&P: 24 y.o. G1P0 [redacted]w[redacted]d presented for labor management s/p SROM at 0200 on 08/03/20. #Labor: Currently making slow progress with pushing. S/p cytotec x1. Currently on pitocin. IUPC in place with amnioinfusion given recurrent variable decels. Reassuringly, good recovery between variable decels and pt making change with fetal station.  #Pain: epidural in place #FWB: Category 2 strip secondary to recurrent variable decels with contractions. Reassuringly, good recovery s/p contractions and moderate variability. 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 Initiated amp and gent for for empiric treatment of chorioamnionitis.  10/03/20, MD 1:31 PM

## 2020-08-04 NOTE — Op Note (Signed)
Carolyn Ellis PROCEDURE DATE: 24/04/2020  PREOPERATIVE DIAGNOSES: Intrauterine pregnancy at [redacted]w[redacted]d weeks gestation; failure to progress: arrest of descent s/p failed vacuum in the setting of Triple I  POSTOPERATIVE DIAGNOSES: The same  PROCEDURE: Primary Low Transverse Cesarean Section  SURGEON:  Dr. Scheryl Darter, MD   Dr. Lynnda Shields, MD  ANESTHESIOLOGY TEAM: Anesthesiologist: Leonides Grills, MD; Mal Amabile, MD; Elmer Picker, MD CRNA: Rhymer, Doree Fudge, CRNA  INDICATIONS: Carolyn Ellis is a 24 y.o. G1P1001 at [redacted]w[redacted]d here for cesarean section secondary to the indications listed under preoperative diagnoses; please see preoperative note for further details.  The risks of surgery were discussed with the patient including but were not limited to: bleeding which [place] require transfusion or reoperation; infection which [place] require antibiotics; injury to bowel, bladder, ureters or other surrounding organs; injury to the fetus; need for additional procedures including hysterectomy in the event of a life-threatening hemorrhage; formation of adhesions; placental abnormalities wth subsequent pregnancies; incisional problems; thromboembolic phenomenon and other postoperative/anesthesia complications.  The patient concurred with the proposed plan, giving informed written consent for the procedure.    FINDINGS:  Viable female infant in cephalic presentation.  Apgars 8 and 9.  Clear amniotic fluid.  Intact placenta, three vessel cord.  Normal uterus, fallopian tubes and ovaries bilaterally.  ANESTHESIA: Epidural  INTRAVENOUS FLUIDS: 1500 ml   ESTIMATED BLOOD LOSS: 537 ml URINE OUTPUT:  200 ml SPECIMENS: Placenta sent to L&D, Arterial blood gas pH 7.26 COMPLICATIONS: None immediate  PROCEDURE IN DETAIL:  The patient preoperatively received intravenous antibiotics and had sequential compression devices applied to her lower extremities.  She was then taken to the operating room where the epidural  anesthesia was dosed up to surgical level and was found to be adequate. She was then placed in a dorsal supine position with a leftward tilt, and prepped and draped in a sterile manner.  A foley catheter was placed into her bladder and attached to constant gravity.  After an adequate timeout was performed, a Pfannenstiel skin incision was made with scalpel and carried through to the underlying layer of fascia. The fascia was incised in the midline, and this incision was extended bilaterally using the Mayo scissors.  Kocher clamps were applied to the superior aspect of the fascial incision and the underlying rectus muscles were dissected off bluntly and sharply.  A similar process was carried out on the inferior aspect of the fascial incision. The rectus muscles were separated in the midline and the peritoneum was entered bluntly. The Alexis self-retaining retractor was introduced into the abdominal cavity.  Attention was turned to the lower uterine segment where a bladder flap was created sharply and bluntly. A low transverse hysterotomy was then made with a scalpel and extended bilaterally bluntly.  The infant was successfully delivered, the cord was clamped and cut after 30 seconds given fetal status, and the infant was handed over to the awaiting neonatology team. Uterine massage was then administered, and the placenta delivered intact with a three-vessel cord. The uterus was then cleared of clots and debris.  The hysterotomy was closed with 0 Vicryl in a running locked fashion, and an imbricating layer was also placed with 0 Vicryl. The pelvis was cleared of all clot and debris. Hemostasis was confirmed on all surfaces.  The retractor was removed.  The peritoneum was closed with a 0 Vicryl running stitch. The fascia was then closed using 0 Vicryl in a running fashion.  The subcutaneous layer was reapproximated  with 2-0 plain gut interrupted stitches, and the skin was closed with a 4-0 Vicryl subcuticular stitch.  The patient tolerated the procedure well. Sponge, instrument and needle counts were correct x 3.  She was taken to the recovery room in stable condition.   Sheila Oats, MD OB Fellow, [facility] 08/04/2020 5:09 PM

## 2020-08-04 NOTE — Lactation Note (Signed)
This note was copied from a baby's chart. Lactation Consultation Note  Patient Name: Boy Kaneisha Aguirre Today's Date: 08/04/2020  Baby boy Dillion Seebeck now 3 hours old.  Mom has not tried to breastfeed him yet and reports she [facility] try tomorrow. Discussed hand expression and possibly pumping.  Mom declines. Mom reports she will go back to work in 6 weeks.  Mom has a used single user DEBP but reports she does not trust the person that gave it to her so doesn't think she will use.  Reviewed FDA guidelines on single user breastpumps.  Urged mom to contact her private insurance company about a DEBP.  Family in room that asked what she can do if she just wants to formula feed.  Discussed ice and cabbage but that her milk will still come in.  Did not inquire about breast changes in pregnacy at this time.  Family just fed 15 ml of formula per mom right before LC arrival. Grandmother holding him swaddled.  Mom reports no breastfeeding education. Reviewed Hunger cues.  Reviewed formula amounts.  Reviewed powdered formula preparation.  Urged STS.   Reviewed Understanding Mother and Baby and feeding log.  [facility] Consultation services handout.  Urged to call lactation as needed.  Maternal Data    Feeding    LATCH Score                   Interventions    Lactation Tools Discussed/Used     Consult Status      Larenz Frasier Michaelle Copas 08/04/2020, 8:55 PM

## 2020-08-04 NOTE — Transfer of Care (Signed)
Immediate Anesthesia Transfer of Care Note  Patient: Carolyn Ellis  Procedure(s) Performed: CESAREAN SECTION  Patient Location: PACU  Anesthesia Type:Epidural  Level of Consciousness: awake, alert  and oriented  Airway & Oxygen Therapy: Patient Spontanous Breathing  Post-op Assessment: Report given to RN and Post -op Vital signs reviewed and stable  Post vital signs: Reviewed and stable  Last Vitals:  Vitals Value Taken Time  BP 115/85 08/04/20 1700  Temp    Pulse 104 08/04/20 1702  Resp 25 08/04/20 1702  SpO2 98 % 08/04/20 1702  Vitals shown include unvalidated device data.  Last Pain:  Vitals:   08/04/20 1430  TempSrc: Axillary  PainSc:          Complications: No complications documented.

## 2020-08-04 NOTE — Progress Notes (Signed)
Labor Progress Note Carolyn Ellis is a 24 y.o. G1P0 at [redacted]w[redacted]d presented for labor management s/p SROM at 0200 on 08/03/20.  S: Doing well without complaints, comfortable with epidural.  O:  BP 123/79   Pulse (!) 101   Temp 98.8 F (37.1 C) (Oral)   Resp 17   Ht 5\' 5"  (1.651 m)   Wt 106 kg   LMP 10/30/2019   SpO2 99%   BMI 38.89 kg/m  EFM: baseline 150/moderate variability/intermittent late decels Toco: q 2-3 minutes, adequate  CVE: Dilation: 9 Effacement (%): 90 Cervical Position: Posterior Station: Plus 1 Presentation: Vertex Exam by:: Dr. [email or web address]   A&P: 24 y.o. G1P0 [redacted]w[redacted]d presented for labor management s/p SROM at 0200 on 08/03/20. #Labor: Progressing well, good cervical change since last exam. S/p cytotec x1 and started on pitocin at 1630. IUPC placed. Continue on pitocin. #Pain: epidural #FWB: Category 2 strip secondary to intermittent late decels, reassuringly has good baseline and varaibility #GBS negative  #Situational Anxiety/Depression: no safety concerns; plan for 1 week mood check, SW consult pp #LSIL on pap smear: plan for 1 year repeat pap  10/03/20, MD PGY1 1:09 AM

## 2020-08-04 NOTE — Discharge Summary (Signed)
Postpartum Discharge Summary       Patient Name: Carolyn Ellis DOB: 08/26/1996 MRN: [id number]  Date of admission: 08/03/2020 Delivery date:08/04/2020  Delivering provider: Woodroe Mode  Date of discharge: 08/06/2020  Admitting diagnosis: Normal labor [O80, Z37.9] Intrauterine pregnancy: [redacted]w[redacted]d    Secondary diagnosis:  Principal Problem:   Cesarean delivery delivered Active Problems:   LGSIL on Pap smear of cervix  Additional problems: as noted above   Discharge diagnosis: Cesarean delivery delivered                                           Post partum procedures: none Augmentation: Pitocin and Cytotec Complications: Triple I (maternal temp to 101.63F and fetal tachycardia intrapartum), arrest of descent s/p failed vacuum  Hospital course: Induction of Labor With Cesarean Section   24y.o. yo G1P1001 at 330w0das admitted to the hospital 08/03/2020 for induction of labor. Patient had a labor course significant for failed vacuum in the setting of Triple I (maternal temp to 101.63F and fetal tachycardia intrapartum). Pt received ampicillin and gentamycin prior to delivery. The patient ultimately went for cesarean section due to Arrest of Descent s/p failed vacuum. Delivery details are as follows: Membrane Rupture Time/Date: 2:00 AM ,08/04/2020   Delivery Method:C-Section, Low Transverse  Details of operation can be found in separate operative Note.  Patient had an uncomplicated postpartum course. She is ambulating, tolerating a regular diet, passing flatus, and urinating well.  Patient is discharged home in stable condition on 08/06/20.      Newborn Data: Birth date:08/04/2020  Birth time:3:58 PM  Gender:Female  Living status:Living  Apgars:8 ,9  Weight:3997 g                                Magnesium Sulfate received: No BMZ received: No Rhophylac:N/A MMR:N/A T-DaP:Given prenatally Flu: No Transfusion:No  Physical exam  Vitals:   08/05/20 0605 08/05/20 1406 08/05/20 2123  08/06/20 0603  BP: 117/66 104/68 113/62 119/79  Pulse: 79 (!) 103 (!) 105 91  Resp: _0 Temp: 98.1 F (36.7 C) 97.6 F (36.4 C) 98.1 F (36.7 C) 97.7 F (36.5 C)  TempSrc: Oral Oral Oral Oral  SpO2: 93%  98% 98%  Weight:      Height:       General: alert, cooperative and no distress Lochia: appropriate Uterine Fundus: firm Incision: Healing well with no significant drainage, Dressing is clean, dry, and intact DVT Evaluation: No evidence of DVT seen on physical exam. Calf/Ankle edema is present Labs: Lab Results  Component Value Date   WBC 26.4 (H) 08/05/2020   HGB 7.8 (L) 08/05/2020   HCT 25.8 (L) 08/05/2020   MCV 80.4 08/05/2020   PLT 198 08/05/2020   CMP Latest Ref Rng & Units 08/04/2020  Glucose 70 - 99 mg/dL 106(H)  BUN 6 - 20 mg/dL 7  Creatinine 0.44 - 1.00 mg/dL 0.95  Sodium 135 - 145 mmol/L 135  Potassium 3.5 - 5.1 mmol/L 3.3(L)  Chloride 98 - 111 mmol/L 105  CO2 22 - 32 mmol/L 18(L)  Calcium 8.9 - 10.3 mg/dL 8.3(L)  Total Protein 6.5 - 8.1 g/dL 5.3(L)  Total Bilirubin 0.3 - 1.2 mg/dL 0.9  Alkaline Phos 38 - 126 U/L 138(H)  AST 15 - 41 U/L  21  ALT 0 - 44 U/L 11   Edinburgh Score: Edinburgh Postnatal Depression Scale Screening Tool 08/06/2020  I have been able to laugh and see the funny side of things. 0  I have looked forward with enjoyment to things. 0  I have blamed myself unnecessarily when things went wrong. 0  I have been anxious or worried for no good reason. 2  I have felt scared or panicky for no good reason. 0  Things have been getting on top of me. 1  I have been so unhappy that I have had difficulty sleeping. 0  I have felt sad or miserable. 0  I have been so unhappy that I have been crying. 1  The thought of harming myself has occurred to me. 0  Edinburgh Postnatal Depression Scale Total 4     After visit meds:  Allergies as of 08/06/2020      Reactions   Augmentin [amoxicillin-pot Clavulanate] Rash   Ampicillin tolerated during  10/6 admission with no AE   Penicillins Rash   Ampicillin tolerated during 10/6 admission with no AE      Medication List    STOP taking these medications   Blood Pressure Monitor Misc   NON FORMULARY   omeprazole 10 MG capsule Commonly known as: PRILOSEC   ondansetron 4 MG disintegrating tablet Commonly known as: ZOFRAN-ODT     TAKE these medications   ferrous sulfate 325 (65 FE) MG tablet Take 1 tablet (325 mg total) by mouth 2 (two) times daily with a meal.   ibuprofen 800 MG tablet Commonly known as: ADVIL Take 1 tablet (800 mg total) by mouth every 8 (eight) hours.        Discharge home in stable condition Infant Feeding: Bottle Infant Disposition:home with mother Discharge instruction: per After Visit Summary and Postpartum booklet. Activity: Advance as tolerated. Pelvic rest for 6 weeks.  Diet: routine diet Future Appointments:No future appointments. Follow up Visit:   Please schedule this patient for a In person postpartum visit in 4 weeks with the following provider: Any provider. Additional Postpartum F/U:Postpartum Depression checkup in 1 week, Incision check 1 week and BP check 1 week  Low risk pregnancy complicated by: situational anxiety/depression, primary Cesarean secondary to failed to descent s/p failed vacuum in the setting of chorioamnionitis Delivery mode:  C-Section, Low Transverse  Anticipated Birth Control:  Depo   08/06/2020 Maryann Conners, CNM

## 2020-08-04 NOTE — Progress Notes (Signed)
Labor Progress Note Carolyn Ellis is a 24 y.o. G1P0 at [redacted]w[redacted]d presented for labor management s/p SROM at 0200 on 08/03/20.  S: Pt endorsing notable fatigue s/p several hours of pushing and failed vacuum as noted below.  O:  BP (!) 133/59   Pulse (!) 105   Temp (!) 101.6 F (38.7 C) (Axillary)   Resp (!) 22   Ht 5\' 5"  (1.651 m)   Wt 106 kg   LMP 10/30/2019   SpO2 96%   BMI 38.89 kg/m  EFM: baseline 150/moderate variability/variable decels with contractions Toco: q 2-3 minutes  CVE: Dilation: 10 Dilation Complete Date: 08/04/20 Dilation Complete Time: 0915 Effacement (%): 100 Cervical Position: Posterior Station: Plus 1 Presentation: Vertex Exam by:: [email or web address], RN   A&P: 24 y.o. G1P0 [redacted]w[redacted]d presented for labor management s/p SROM at 0200 on 08/03/20. #Labor: Pt with minimal progress s/p several hours of pushing. S/p verbal consent, vacuum was attempted with 2 pop-offs and unfortunately no additional benefit. Given circumstances as noted above in the setting of Triple I pt was then consented as noted below for primary Cesarean secondary to arrest of descent.  The risks of cesarean section were discussed with the patient including but were not limited to: bleeding which [place] require transfusion or reoperation; infection which [place] require antibiotics; injury to bowel, bladder, ureters or other surrounding organs; injury to the fetus; need for additional procedures including hysterectomy in the event of a life-threatening hemorrhage; placental abnormalities wth subsequent pregnancies, incisional problems, thromboembolic phenomenon and other postoperative/anesthesia complications. The patient concurred with the proposed plan, giving informed written consent for the procedure.  Patient has been NPO since yesterday; she will remain NPO for procedure. Anesthesia and OR aware.  Preoperative prophylactic antibiotics and SCDs ordered on call to the OR.  To OR when ready. Dr. 10/03/20 consented patient and  has signed the consent form with the patient. #Pain: epidural in place #FWB: Category 2 strip secondary to recurrent variable decels with contractions; reassuringly appropriate baseline with good variability. Plan for Cesarean as noted above. #GBS negative  #Situational Anxiety/Depression: no safety concerns; plan for 1 week mood check, SW consult pp #LSIL on pap smear: plan for 1 year repeat pap #Triple I: ongoing fetal tachycardia with max maternal temp to 101.57F in the setting of SROM >24hrs. Continued on amp and gent for for empiric treatment of chorioamnionitis. Will also dose azithromycin 500mg  x1 in prep for OR.  Debroah Loop, MD 3:35 PM

## 2020-08-05 ENCOUNTER — Encounter: Payer: 59 | Admitting: Advanced Practice Midwife

## 2020-08-05 ENCOUNTER — Encounter (HOSPITAL_COMMUNITY): Payer: Self-pay | Admitting: Obstetrics & Gynecology

## 2020-08-05 LAB — CBC
HCT: 25.8 % — ABNORMAL LOW (ref 36.0–46.0)
Hemoglobin: 7.8 g/dL — ABNORMAL LOW (ref 12.0–15.0)
MCH: 24.3 pg — ABNORMAL LOW (ref 26.0–34.0)
MCHC: 30.2 g/dL (ref 30.0–36.0)
MCV: 80.4 fL (ref 80.0–100.0)
Platelets: 198 10*3/uL (ref 150–400)
RBC: 3.21 MIL/uL — ABNORMAL LOW (ref 3.87–5.11)
RDW: 17.2 % — ABNORMAL HIGH (ref 11.5–15.5)
WBC: 26.4 10*3/uL — ABNORMAL HIGH (ref 4.0–10.5)
nRBC: 0 % (ref 0.0–0.2)

## 2020-08-05 MED ORDER — MEDROXYPROGESTERONE ACETATE 150 MG/ML IM SUSP
150.0000 mg | Freq: Once | INTRAMUSCULAR | Status: AC
  Administered 2020-08-06: 150 mg via INTRAMUSCULAR
  Filled 2020-08-05 (×2): qty 1

## 2020-08-05 NOTE — Progress Notes (Addendum)
Post Op Day 1  Subjective Tolerating PO. Plans for OOB with nurse this morning. Denies BM, flatus. Denies dizziness, lightheadedness, tachycardia. Appropriate Lochia. Pain well controlled.  Objective BP 117/66 (BP Location: Right Arm)   Pulse 79   Temp 98.1 F (36.7 C) (Oral)   Resp 18   Ht 5' 5"  (1.651 m)   Wt 106 kg   LMP 10/30/2019   SpO2 93%   Breastfeeding Unknown   BMI 38.89 kg/m  Intake/Output      10/07 0701 - 10/08 0700 10/08 0701 - 10/09 0700   I.V. (mL/kg) 1000 (9.4)    IV Piggyback 350    Total Intake(mL/kg) 1350 (12.7)    Urine (mL/kg/hr) 2325 (0.9)    Blood 537    Total Output 2862    Net -1512           Physical Exam:  General: alert, cooperative, NAD Lochia: appropriate Uterine Fundus: firm Incision: healing well, no significant drainage, no dehiscence, no significant erythema DVT Evaluation: No evidence of DVT, extremities warm and well perfused.   Recent Labs    08/03/20 0426 08/04/20 1523 08/05/20 0526  HGB 10.2* 9.4* 7.8*  HCT 34.7* 30.6* 25.8*    Assessment & Plan Post Op Day # 1 Plan for dc in 1-2 days. Feed: bottle  Contraception: depo prior to discharge  Keep incision site clean and dry Circumcision: yes, not yet ordered nor consented   Hemoglobin this AM 7.8. Spoke to RN and patient not dizzy or symptomatic when OOB this AM.   Social Work: No barriers identified     LOS: 2 days   Carolyn Ellis 08/05/2020, 9:20 AM   I personally saw and evaluated the patient, performing the key elements of the service. I developed and verified the management plan that is described in the resident's/student's note, and I agree with the content with my edits above. VSS, HRR&R, Resp unlabored, Legs neg.  Nigel Berthold, CNM 08/05/2020 3:22 PM

## 2020-08-05 NOTE — Lactation Note (Signed)
This note was copied from a baby's chart. Lactation Consultation Note  Patient Name: Carolyn Ellis Today's Date: 08/05/2020 Reason for consult: Follow-up assessment;Primapara;1st time breastfeeding;Early term 37-38.6wks  Follow up visit to 23 hours old infant with 0.80% weight loss of a P1. Mother states has been bottle-feeding formula. Mother has not latched infant to breast. Assisted with positioning to left breast, football hold. Infant is spitty and gaggy. Used bulb-syringe several times to clear infant's mouth. Hand-expressed colostrum easily and attempted latch. Infant is uninterested. After a few unsuccessful attempts encouraged mother to STS but mother is tired and prefers to have infant swaddled in basinet.   Encourage to follow babies' hunger and fullness cues. Reviewed importance to offer the breast 8 to 12 times in a 24-hour period for proper stimulation and to establish good milk supply. Reviewed signs of good milk transfer. Promoted maternal rest, hydration and food intake. Reviewed newborn behavior and expectations with mother and encouraged to contact [facility] for support, questions or concerns.    All questions answered at this time. Encouraged mother to call for next feeding.   Maternal Data Formula Feeding for Exclusion: No  Feeding Feeding Type: Breast Fed Nipple Type: Slow - flow  LATCH Score Latch: Too sleepy or reluctant, no latch achieved, no sucking elicited.  Audible Swallowing: None  Type of Nipple: Flat (edematous)  Comfort (Breast/Nipple): Soft / non-tender  Hold (Positioning): Assistance needed to correctly position infant at breast and maintain latch.  LATCH Score: 4  Interventions Interventions: Breast feeding basics reviewed;Assisted with latch;Hand express;Adjust position;Support pillows;Expressed milk  Consult Status Consult Status: Follow-up Date: 08/06/20 Follow-up type: In-patient    Carolyn Ellis 08/05/2020, 2:59 PM

## 2020-08-05 NOTE — Progress Notes (Signed)
CSW received consult for hx of Anxiety and Depression.  CSW met with MOB to offer support and complete assessment.    CSW congratulated MOB on the birth of infant. CSW advised MOB of the HIPPA policy in which MOB expressed that guest in the room where her parents, and MOB was fine with them remaining in the room while CSW spoke with her. CSW understanding and advised MOB of CSW's role and the reason for CSW coming to speak with her. MOB expressed never being given a diagnosis of anxiety or depression MOB reported that she did feel that she has anxiety and depression at the start of her pregnancy as a result of "being nervous about the pregnancy". MOB expressed something stating "and a lot to do with the sperm donor" refereeing to FOB. MOB did not go into detail with CSW regarding FOB. MOB expressed that she has no other known mental health hx and expressed no SI, HI or DV to this CSW.   CSW inquired from MOB on who her supports where in which MOB expressed that she has support from her mom and dad. MOB reported that she has all needed items to care for infant accept for milk. MOB did express to CSW that she has the ability to purchase the milk until her WIC is established. CSW provided MOB with contact information for [place] [facility]IC as well. MOB expressed no other needs or concerns to this CSW.   CSW provided education regarding the baby blues period vs. perinatal mood disorders, discussed treatment and gave resources for mental health follow up if concerns arise.  CSW recommends self-evaluation during the postpartum time period using the New Mom Checklist from Postpartum Progress and encouraged MOB to contact a medical professional if symptoms are noted at any time.   CSW provided review of Sudden Infant Death Syndrome (SIDS) precautions.  MOB expressed that infant will sleep in basinet once arrived.    CSW identifies no further need for intervention and no barriers to discharge at this  time.    Elnore Cosens S. Altin Sease, MSW, LCSW [facility] [phone]  

## 2020-08-06 MED ORDER — IBUPROFEN 800 MG PO TABS: 800.0000 mg | ORAL_TABLET | Freq: Three times a day (TID) | ORAL | 0 refills | Status: DC

## 2020-08-06 NOTE — Discharge Instructions (Signed)
Cesarean Delivery, Care After This sheet gives you information about how to care for yourself after your procedure. Your health care provider Bies also give you more specific instructions. If you have problems or questions, contact your health care provider. What can I expect after the procedure? After the procedure, it is common to have:  A small amount of blood or clear fluid coming from the incision.  Some redness, swelling, and pain in your incision area.  Some abdominal pain and soreness.  Vaginal bleeding (lochia). Even though you did not have a vaginal delivery, you will still have vaginal bleeding and discharge.  Pelvic cramps.  Fatigue. You Sperl have pain, swelling, and discomfort in the tissue between your vagina and your anus (perineum) if:  Your C-section was unplanned, and you were allowed to labor and push.  An incision was made in the area (episiotomy) or the tissue tore during attempted vaginal delivery. Follow these instructions at home: Incision care   Follow instructions from your health care provider about how to take care of your incision. Make sure you: ? Wash your hands with soap and water before you change your bandage (dressing). If soap and water are not available, use hand sanitizer. ? If you have a dressing, change it or remove it as told by your health care provider. ? Leave stitches (sutures), skin staples, skin glue, or adhesive strips in place. These skin closures Furey need to stay in place for 2 weeks or longer. If adhesive strip edges start to loosen and curl up, you Winker trim the loose edges. Do not remove adhesive strips completely unless your health care provider tells you to do that.  Check your incision area every day for signs of infection. Check for: ? More redness, swelling, or pain. ? More fluid or blood. ? Warmth. ? Pus or a bad smell.  Do not take baths, swim, or use a hot tub until your health care provider says it's okay. Ask your health  care provider if you can take showers.  When you cough or sneeze, hug a pillow. This helps with pain and decreases the chance of your incision opening up (dehiscing). Do this until your incision heals. Medicines  Take over-the-counter and prescription medicines only as told by your health care provider.  If you were prescribed an antibiotic medicine, take it as told by your health care provider. Do not stop taking the antibiotic even if you start to feel better.  Do not drive or use heavy machinery while taking prescription pain medicine. Lifestyle  Do not drink alcohol. This is especially important if you are breastfeeding or taking pain medicine.  Do not use any products that contain nicotine or tobacco, such as cigarettes, e-cigarettes, and chewing tobacco. If you need help quitting, ask your health care provider. Eating and drinking  Drink at least 8 eight-ounce glasses of water every day unless told not to by your health care provider. If you breastfeed, you Harbeson need to drink even more water.  Eat high-fiber foods every day. These foods Sliva help prevent or relieve constipation. High-fiber foods include: ? Whole grain cereals and breads. ? Brown rice. ? Beans. ? Fresh fruits and vegetables. Activity   If possible, have someone help you care for your baby and help with household activities for at least a few days after you leave the hospital.  Return to your normal activities as told by your health care provider. Ask your health care provider what activities are safe for   you.  Rest as much as possible. Try to rest or take a nap while your baby is sleeping.  Do not lift anything that is heavier than 10 lbs (4.5 kg), or the limit that you were told, until your health care provider says that it is safe.  Talk with your health care provider about when you can engage in sexual activity. This Habermann depend on your: ? Risk of infection. ? How fast you heal. ? Comfort and desire to  engage in sexual activity. General instructions  Do not use tampons or douches until your health care provider approves.  Wear loose, comfortable clothing and a supportive and well-fitting bra.  Keep your perineum clean and dry. Wipe from front to back when you use the toilet.  If you pass a blood clot, save it and call your health care provider to discuss. Do not flush blood clots down the toilet before you get instructions from your health care provider.  Keep all follow-up visits for you and your baby as told by your health care provider. This is important. Contact a health care provider if:  You have: ? A fever. ? Bad-smelling vaginal discharge. ? Pus or a bad smell coming from your incision. ? Difficulty or pain when urinating. ? A sudden increase or decrease in the frequency of your bowel movements. ? More redness, swelling, or pain around your incision. ? More fluid or blood coming from your incision. ? A rash. ? Nausea. ? Little or no interest in activities you used to enjoy. ? Questions about caring for yourself or your baby.  Your incision feels warm to the touch.  Your breasts turn red or become painful or hard.  You feel unusually sad or worried.  You vomit.  You pass a blood clot from your vagina.  You urinate more than usual.  You are dizzy or light-headed. Get help right away if:  You have: ? Pain that does not go away or get better with medicine. ? Chest pain. ? Difficulty breathing. ? Blurred vision or spots in your vision. ? Thoughts about hurting yourself or your baby. ? New pain in your abdomen or in one of your legs. ? A severe headache.  You faint.  You bleed from your vagina so much that you fill more than one sanitary pad in one hour. Bleeding should not be heavier than your heaviest period. Summary  After the procedure, it is common to have pain at your incision site, abdominal cramping, and slight bleeding from your vagina.  Check  your incision area every day for signs of infection.  Tell your health care provider about any unusual symptoms.  Keep all follow-up visits for you and your baby as told by your health care provider. This information is not intended to replace advice given to you by your health care provider. Make sure you discuss any questions you have with your health care provider. Document Revised: 04/23/2018 Document Reviewed: 04/23/2018 Elsevier Patient Education  2020 [employer]vier Inc.  

## 2020-08-06 NOTE — Lactation Note (Signed)
This note was copied from a baby's chart. Lactation Consultation Note  Patient Name: Carolyn Ellis Today's Date: 08/06/2020   LC to room. Pt sleeping soundly. Will plan f/u later today. No charge.  Maternal Data    Feeding Feeding Type: Bottle Fed - Formula Nipple Type: Slow - flow  LATCH Score                   Interventions    Lactation Tools Discussed/Used     Consult Status      Carolyn Ellis 08/06/2020, 11:28 AM

## 2020-08-07 LAB — TYPE AND SCREEN
ABO/RH(D): O POS
Antibody Screen: POSITIVE
Unit division: 0
Unit division: 0
Unit division: 0

## 2020-08-07 LAB — BPAM RBC
Blood Product Expiration Date: 202111012359
Blood Product Expiration Date: 202111012359
Blood Product Expiration Date: 202111012359
ISSUE DATE / TIME: 202109260801
Unit Type and Rh: 5100
Unit Type and Rh: 5100
Unit Type and Rh: 5100

## 2020-08-09 ENCOUNTER — Emergency Department (HOSPITAL_COMMUNITY)
Admission: EM | Admit: 2020-08-09 | Discharge: 2020-08-09 | Disposition: A | Discharge status: Home / Self Care | Payer: 59 | Attending: Emergency Medicine | Admitting: Emergency Medicine

## 2020-08-09 ENCOUNTER — Other Ambulatory Visit: Payer: Self-pay

## 2020-08-09 ENCOUNTER — Telehealth: Payer: Self-pay | Admitting: Obstetrics & Gynecology

## 2020-08-09 ENCOUNTER — Encounter (HOSPITAL_COMMUNITY): Payer: Self-pay | Admitting: Emergency Medicine

## 2020-08-09 DIAGNOSIS — R21 Rash and other nonspecific skin eruption: Secondary | ICD-10-CM | POA: Diagnosis present

## 2020-08-09 DIAGNOSIS — L509 Urticaria, unspecified: Secondary | ICD-10-CM | POA: Diagnosis not present

## 2020-08-09 MED ORDER — PREDNISONE 20 MG PO TABS: ORAL_TABLET | ORAL | 0 refills | Status: DC

## 2020-08-09 MED ORDER — PREDNISONE 10 MG PO TABS: ORAL_TABLET | ORAL | 0 refills | Status: DC

## 2020-08-09 MED ORDER — DIPHENHYDRAMINE HCL 25 MG PO TABS: 25.0000 mg | ORAL_TABLET | Freq: Four times a day (QID) | ORAL | 0 refills | Status: DC

## 2020-08-09 MED ORDER — FAMOTIDINE 20 MG PO TABS
20.0000 mg | ORAL_TABLET | Freq: Once | ORAL | Status: AC
  Administered 2020-08-09: 20 mg via ORAL
  Filled 2020-08-09: qty 1

## 2020-08-09 MED ORDER — DIPHENHYDRAMINE HCL 25 MG PO CAPS
25.0000 mg | ORAL_CAPSULE | Freq: Once | ORAL | Status: AC
  Administered 2020-08-09: 25 mg via ORAL
  Filled 2020-08-09: qty 1

## 2020-08-09 MED ORDER — METHYLPREDNISOLONE SODIUM SUCC 125 MG IJ SOLR
125.0000 mg | Freq: Once | INTRAMUSCULAR | Status: AC
  Administered 2020-08-09: 125 mg via INTRAMUSCULAR
  Filled 2020-08-09: qty 2

## 2020-08-09 MED ORDER — FAMOTIDINE 20 MG PO TABS: 20.0000 mg | ORAL_TABLET | Freq: Two times a day (BID) | ORAL | 0 refills | Status: DC

## 2020-08-09 NOTE — ED Provider Notes (Signed)
[facility] EMERGENCY DEPARTMENT Provider Note   CSN: [id number] Arrival date & time: 08/09/20  0115     History Chief Complaint  Patient presents with  . Rash    Carolyn Ellis is a 24 y.o. female.  Recently gave birth. Unsure if was given something in hospital that caused allergic reaction (is allergic to PCN's). No sob, nausea, vomiting, syncope, light headedness or other associated symptoms. No other new allergens.   The history is provided by the patient.  Rash Location:  Shoulder/arm and torso Shoulder/arm rash location:  L arm and R arm Torso rash location:  Upper back, lower back, L chest, R chest, abd RUQ, abd RLQ, abd LLQ and abd LUQ Severity:  Mild Duration:  2 days Timing:  Constant Progression:  Worsening Chronicity:  New      Past Medical History:  Diagnosis Date  . Abnormal Pap smear of cervix    ASCUS    Patient Active Problem List   Diagnosis Date Noted  . Cesarean delivery delivered 08/04/2020  . Normal labor 08/03/2020    Past Surgical History:  Procedure Laterality Date  . CESAREAN SECTION  08/04/2020   Procedure: CESAREAN SECTION;  Surgeon: Adam Phenix, MD;  Location: [facility] LD ORS;  Service: Obstetrics;;  . NO PAST SURGERIES       OB History    Gravida  1   Para  1   Term  1   Preterm      AB      Living  1     SAB      TAB      Ectopic      Multiple  0   Live Births  1           Family History  Problem Relation Age of Onset  . Healthy Mother   . Multiple sclerosis Mother   . Heart attack Maternal Grandfather   . Stroke Paternal Grandfather   . Cancer Maternal Grandmother   . Hypertension Father   . Drug abuse Paternal Aunt     Social History   Tobacco Use  . Smoking status: Never Smoker  . Smokeless tobacco: Never Used  Vaping Use  . Vaping Use: Never used  Substance Use Topics  . Alcohol use: No    Alcohol/week: 0.0 standard drinks  . Drug use: No    Home Medications Prior to Admission  medications   Medication Sig Start Date End Date Taking? Authorizing Provider  diphenhydrAMINE (BENADRYL) 25 MG tablet Take 1 tablet (25 mg total) by mouth every 6 (six) hours. 08/09/20   Dontarious Schaum, Barbara Cower, MD  famotidine (PEPCID) 20 MG tablet Take 1 tablet (20 mg total) by mouth 2 (two) times daily. 08/09/20   Hildegard Hlavac, Barbara Cower, MD  ferrous sulfate 325 (65 FE) MG tablet Take 1 tablet (325 mg total) by mouth 2 (two) times daily with a meal. 06/28/20   Cheral Marker, CNM  ibuprofen (ADVIL) 800 MG tablet Take 1 tablet (800 mg total) by mouth every 8 (eight) hours. 08/06/20   Gerrit Heck, CNM  predniSONE (DELTASONE) 20 MG tablet 3 tabs po daily x 3 days, then 2 tabs x 3 days, then 1.5 tabs x 3 days, then 1 tab x 3 days, then 0.5 tabs x 3 days 08/09/20   Haruna Rohlfs, Barbara Cower, MD    Allergies    Augmentin [amoxicillin-pot clavulanate] and Penicillins  Review of Systems   Review of Systems  Skin: Positive for rash.  All  other systems reviewed and are negative.   Physical Exam Updated Vital Signs BP 133/79   Pulse 99   Temp 98.4 F (36.9 C) (Oral)   Resp 18   Ht 5\' 5"  (1.651 m)   SpO2 100%   BMI 38.89 kg/m   Physical Exam Vitals and nursing note reviewed.  Constitutional:      Appearance: She is well-developed.  HENT:     Head: Normocephalic and atraumatic.     Mouth/Throat:     Mouth: Mucous membranes are moist.     Pharynx: Oropharynx is clear.  Eyes:     Pupils: Pupils are equal, round, and reactive to light.  Cardiovascular:     Rate and Rhythm: Normal rate and regular rhythm.  Pulmonary:     Effort: No respiratory distress.     Breath sounds: No stridor. No wheezing.  Abdominal:     General: There is no distension.  Musculoskeletal:     Cervical back: Normal range of motion.  Skin:    Findings: Rash (diffuse hives on torso and arms) present.  Neurological:     General: No focal deficit present.     Mental Status: She is alert.     ED Results / Procedures / Treatments     Labs (all labs ordered are listed, but only abnormal results are displayed) Labs Reviewed - No data to display  EKG None  Radiology No results found.  Procedures Procedures (including critical care time)  Medications Ordered in ED Medications  methylPREDNISolone sodium succinate (SOLU-MEDROL) 125 mg/2 mL injection 125 mg (125 mg Intramuscular Given 08/09/20 0327)  famotidine (PEPCID) tablet 20 mg (20 mg Oral Given 08/09/20 0328)  diphenhydrAMINE (BENADRYL) capsule 25 mg (25 mg Oral Given 08/09/20 0327)    ED Course  I have reviewed the triage vital signs and the nursing notes.  Pertinent labs & imaging results that were available during my care of the patient were reviewed by me and considered in my medical decision making (see chart for details).    MDM Rules/Calculators/A&P                          Treated here. No worsneing. No new symptoms. Will continue allergy treatment at home keeping en eye out for s/s of anaphylaxis. Final Clinical Impression(s) / ED Diagnoses Final diagnoses:  Rash  Urticaria    Rx / DC Orders ED Discharge Orders         Ordered    famotidine (PEPCID) 20 MG tablet  2 times daily        08/09/20 0433    diphenhydrAMINE (BENADRYL) 25 MG tablet  Every 6 hours        08/09/20 0433    predniSONE (DELTASONE) 20 MG tablet        08/09/20 0433           Adysson Revelle, 10/09/20, MD 08/09/20 0543

## 2020-08-09 NOTE — ED Triage Notes (Signed)
Pt arrives with red rash on bilateral arms and back, that started on Saturday.

## 2020-08-11 ENCOUNTER — Ambulatory Visit (INDEPENDENT_AMBULATORY_CARE_PROVIDER_SITE_OTHER): Payer: 59 | Admitting: Obstetrics & Gynecology

## 2020-08-11 ENCOUNTER — Encounter: Payer: Self-pay | Admitting: Obstetrics & Gynecology

## 2020-08-11 ENCOUNTER — Other Ambulatory Visit: Payer: Self-pay

## 2020-08-11 VITALS — BP 119/75 | HR 89 | Wt 208.0 lb

## 2020-08-11 DIAGNOSIS — L270 Generalized skin eruption due to drugs and medicaments taken internally: Secondary | ICD-10-CM

## 2020-08-11 DIAGNOSIS — Z98891 History of uterine scar from previous surgery: Secondary | ICD-10-CM

## 2020-08-11 NOTE — Progress Notes (Signed)
  HPI: Patient returns for routine postoperative follow-up having undergone Caesarean section 08/04/20 on .  The patient's immediate postoperative recovery has been unremarkable. Since hospital discharge the patient reports rash, started before left the hospital.   Current Outpatient Medications: diphenhydrAMINE (BENADRYL) 25 MG tablet, Take 1 tablet (25 mg total) by mouth every 6 (six) hours., Disp: 20 tablet, Rfl: 0 famotidine (PEPCID) 20 MG tablet, Take 1 tablet (20 mg total) by mouth 2 (two) times daily., Disp: 10 tablet, Rfl: 0 ferrous sulfate 325 (65 FE) MG tablet, Take 1 tablet (325 mg total) by mouth 2 (two) times daily with a meal., Disp: 60 tablet, Rfl: 3 predniSONE (DELTASONE) 10 MG tablet, Take 4 tablets all at once daily for 10 days, Disp: 40 tablet, Rfl: 0 ibuprofen (ADVIL) 800 MG tablet, Take 1 tablet (800 mg total) by mouth every 8 (eight) hours. (Patient not taking: Reported on 08/11/2020), Disp: 30 tablet, Rfl: 0  No current facility-administered medications for this visit.    Blood pressure 119/75, pulse 89, weight 208 lb (94.3 kg), not currently breastfeeding.  Physical Exam: Widespread maculopapular rash consistent with a drug rash, most likely Unasyn on reviewing her record Incision clean dry intact Diagnostic Tests:   Pathology:   Impression: S/P c section  Post op drug rash, assuming unasyn  Plan: Continue prednison  Follow up: 5  weeks  Received Depo provera injection prior to leaving hospital  Lazaro Arms, MD

## 2020-08-18 IMAGING — US US OB TRANSVAGINAL
1 series · 15 of 28 positions shown · non-contrast
Comparison: None.

CLINICAL DATA: Vaginal bleeding

EXAM:
TRANSVAGINAL OB ULTRASOUND
TECHNIQUE: Transvaginal ultrasound was performed for complete evaluation of the
gestation as well as the maternal uterus, adnexal regions, and
pelvic cul-de-sac.

[Series 1: us ob transvaginal · 15 of 44 slices shown]
[im 1/44]
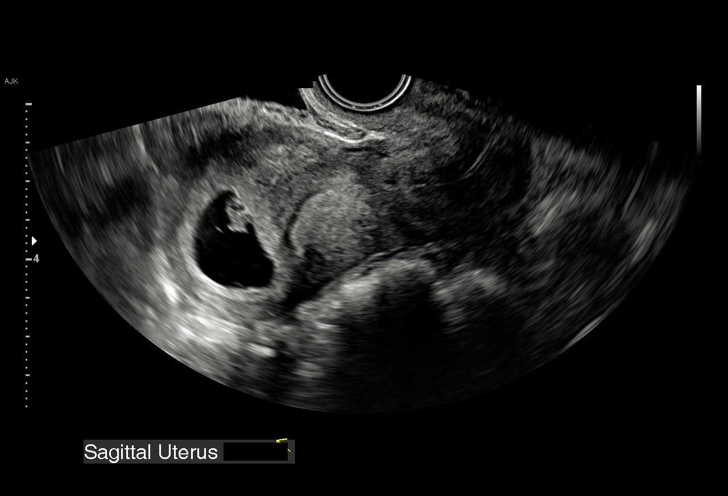
[im 4/44]
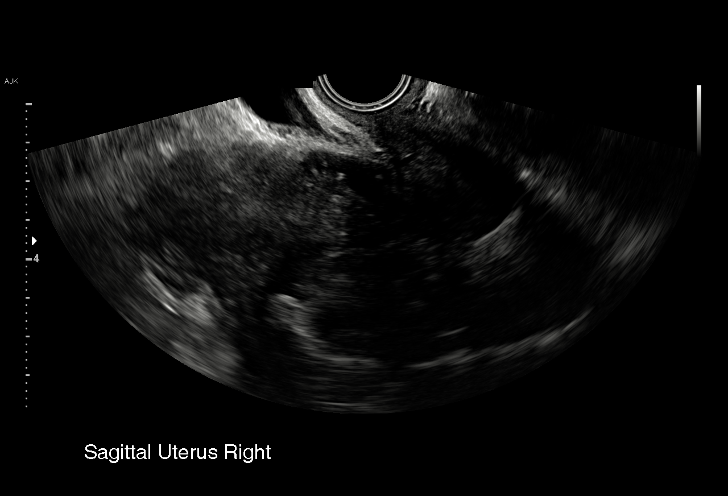
[im 7/44]
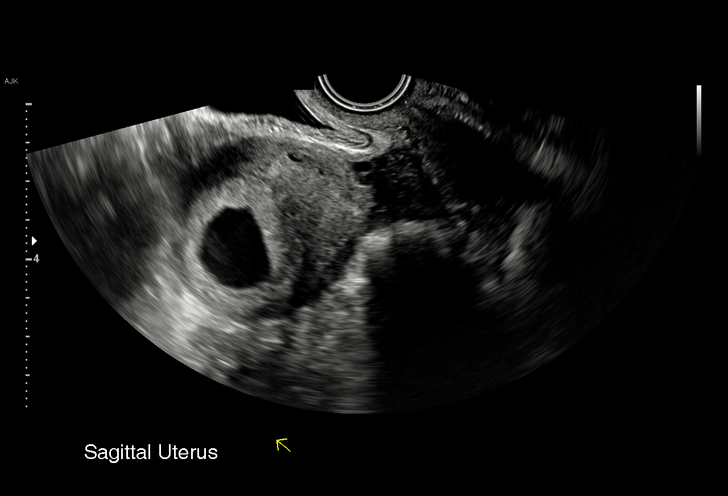
[im 10/44]
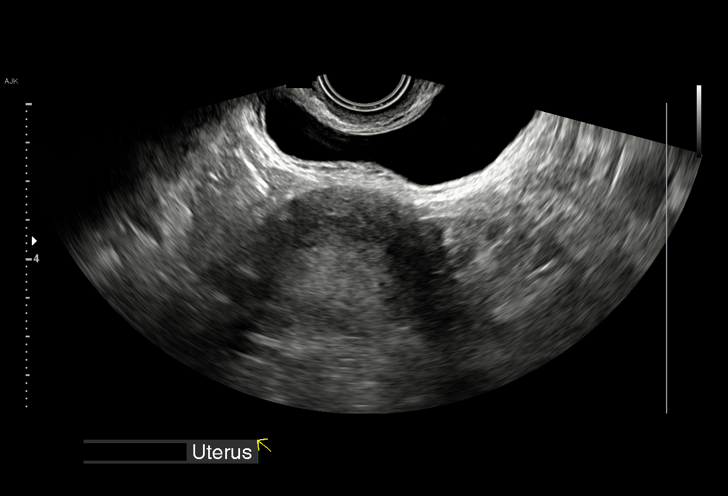
[im 13/44]
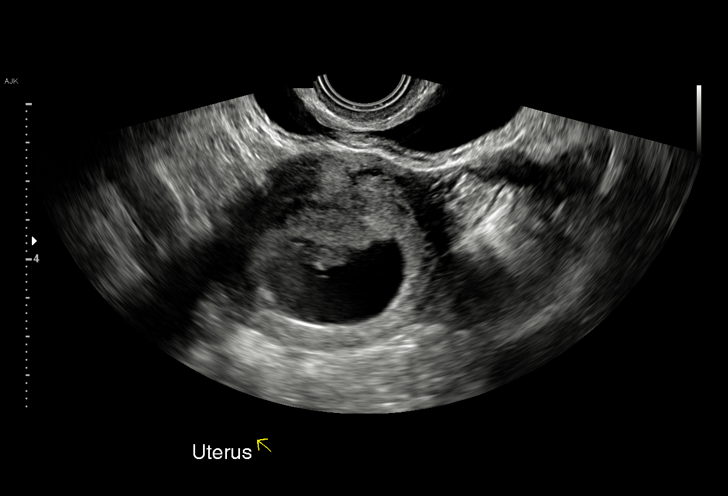
[im 16/44]
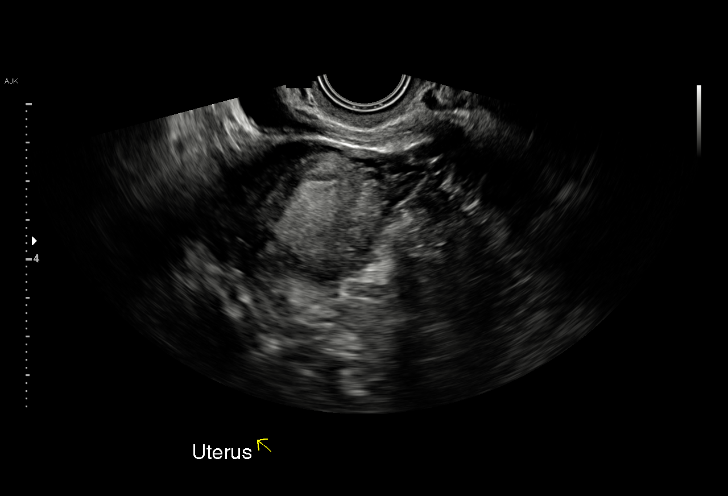
[im 20/44]
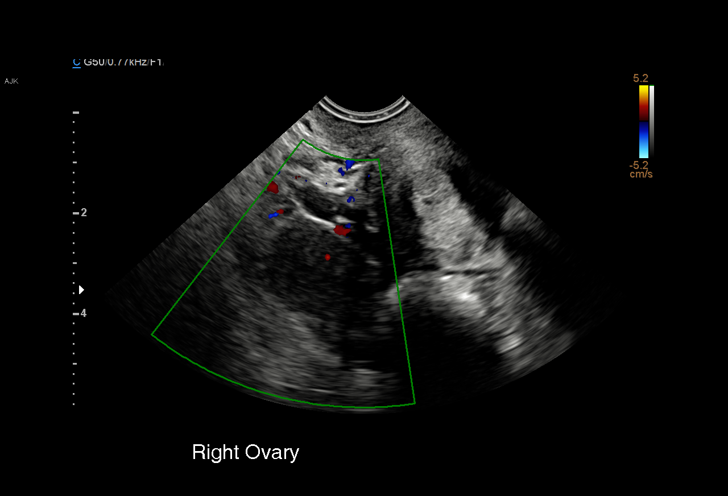
[im 23/44]
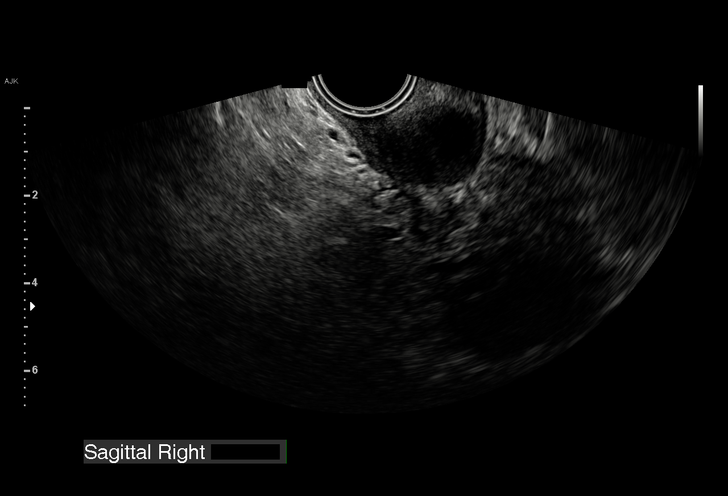
[im 24/44]
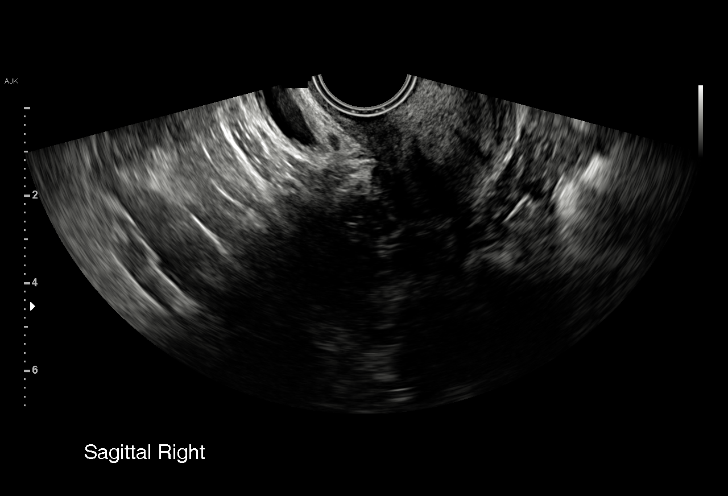
[im 28/44]
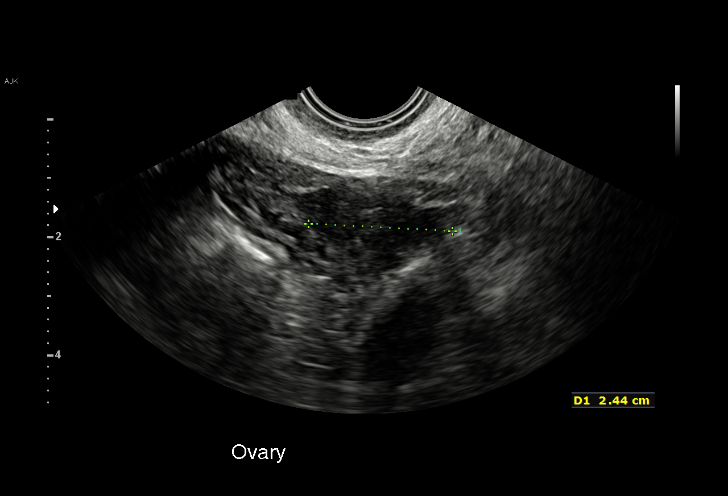
[im 31/44]
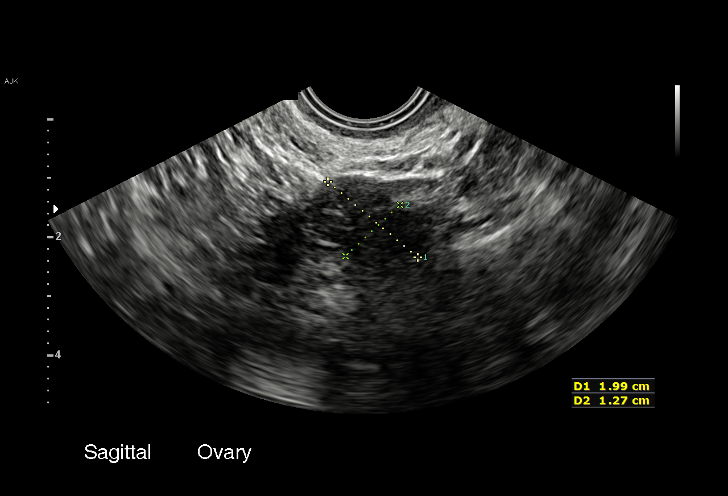
[im 34/44]
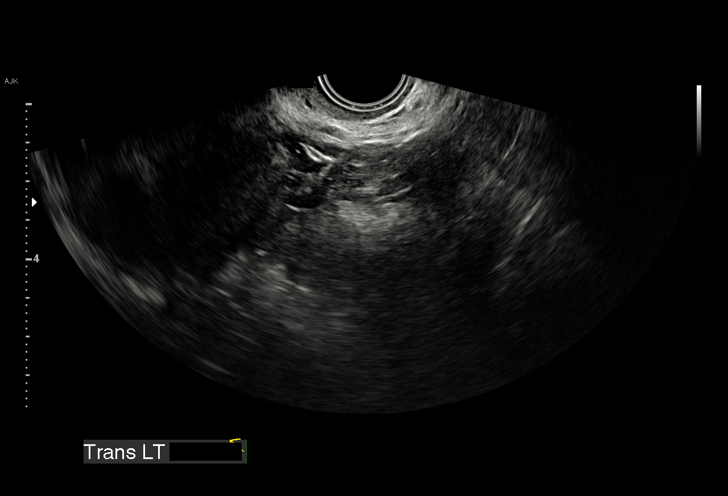
[im 37/44]
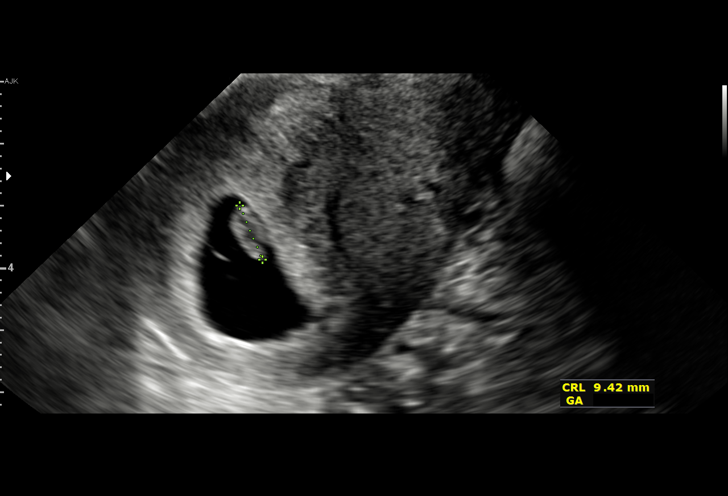
[im 40/44]
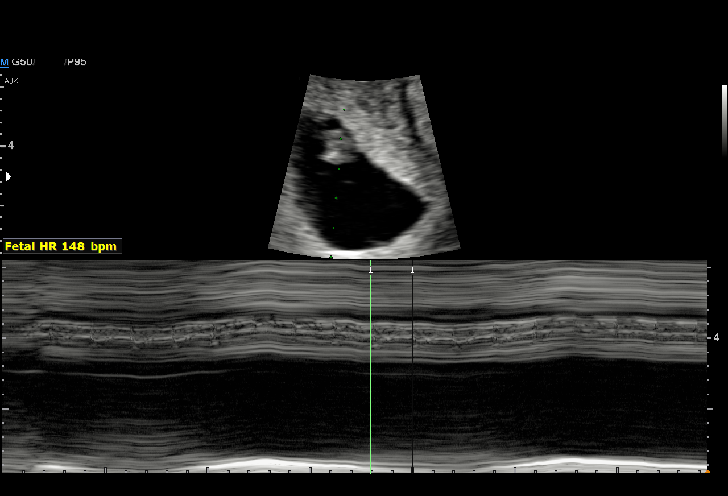
[im 44/44]
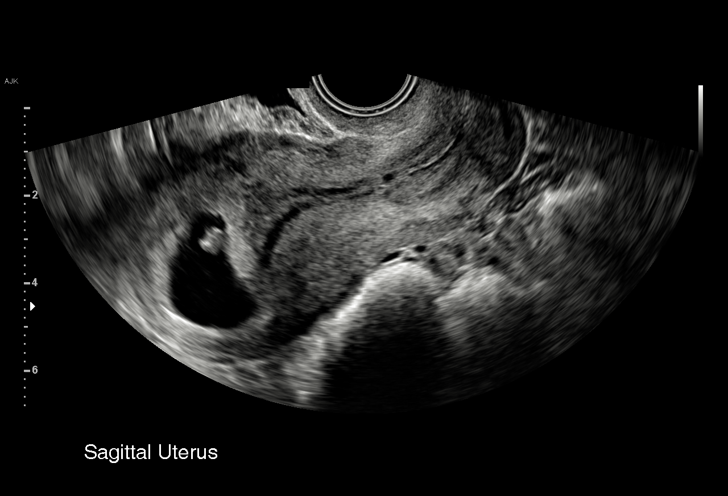

[15 of 28 positions shown; findings below may reference images not displayed]

FINDINGS: Intrauterine gestational sac: Single

Yolk sac:  Visualized.

Embryo:  Visualized.

Cardiac Activity: Visualized.

Heart Rate: 148 bpm

CRL:   9.4 mm   6 w 6 d                  US EDC: 08/12/2020

Subchorionic hemorrhage:  None visualized.

Maternal uterus/adnexae: Right ovary measures 3.0 x 1.9 x 2.1 cm and
the left ovary measures 2.0 x 1.3 x 2.4 cm. No free fluid.
IMPRESSION: 1. Single live intrauterine pregnancy as above, estimated age 6
weeks and 6 days.

## 2020-09-15 ENCOUNTER — Other Ambulatory Visit: Payer: Self-pay

## 2020-09-15 ENCOUNTER — Encounter: Payer: Self-pay | Admitting: Advanced Practice Midwife

## 2020-09-15 ENCOUNTER — Ambulatory Visit (INDEPENDENT_AMBULATORY_CARE_PROVIDER_SITE_OTHER): Payer: 59 | Admitting: Advanced Practice Midwife

## 2020-09-15 MED ORDER — DEPO-SUBQ PROVERA 104 104 MG/0.65ML ~~LOC~~ SUSY: 104.0000 mg | PREFILLED_SYRINGE | SUBCUTANEOUS | 3 refills | Status: DC

## 2020-09-15 NOTE — Progress Notes (Addendum)
Post Partum Visit Note  Carolyn Ellis is a 24 y.o. G35P1001 female who presents for a postpartum visit. She is 6 weeks postpartum following a primary cesarean section for failed VAD. Had PROM w/IOL, developed Triple I. I have fully reviewed the prenatal and intrapartum course. The delivery was at 39 gestational weeks.  Anesthesia: epidural. Postpartum course has been uneventful other than a drug rash (unasyn?), recovered w/steroids.Pecola Leisure is doing well. Baby is feeding by bottle - PARENT'S CHOICE. Bleeding no bleeding. Bowel function is normal. Bladder function is normal. Patient is not sexually active. Contraception method is Depo-Provera injections. Postpartum depression screening: equivocal (11).  ..    Review of Systems Pertinent items are noted in HPI.   Objective:  Blood pressure 128/78, pulse 84, height 5\' 5"  (1.651 m), weight 197 lb (89.4 kg), not currently breastfeeding.  General:  alert, cooperative, and no distress   Breasts:  negative  Lungs: Normal respiratory effort  Heart:  regular rate and rhythm  Abdomen: soft, non-tender, incision well healed   Vulva:  declined  Vagina: declined  Cervix:  declined  Corpus: Well involuted  Adnexa:  not evaluated  Rectal Exam: deckubed        Assessment:    normal postpartum exam. Pap smear not done at today's visit.   Plan:   Essential components of care per ACOG recommendations:  1.  Mood and well being: Patient with equivocal (11) depression screening today. Denies SI/HI.  Not really depressed, but having to be OOW has caused some financial issues.  Encouraged to go to [facility] and see if she now qualifies for services.  Discussed child support (doesn't want to rock that boat). Offered therapy referral, and declined..  - Patient does not use tobacco. If using tobacco we discussed reduction and for recently cessation risk of relapse - hx of drug use? No   If yes, discussed support systems  2. Infant care and feeding:  -Patient currently  breastmilk feeding? Yes If breastmilk feeding discussed return to work and pumping. If needed, patient was provided letter for work to allow for every 2-3 hr pumping breaks, and to be granted a private location to express breastmilk and refrigerated area to store breastmilk. Reviewed importance of draining breast regularly to support lactation. -Social determinants of health (SDOH) reviewed in EPIC. No concernsThe following needs were identified:  Herda be able to get help w/childcare, WIC/food stamps now that she is a single mother  3. Sexuality, contraception and birth spacing - Patient does not want a pregnancy in the next year.  Desired family size is 1 children.  - Patient desired Depo-Provera was given after delivery-wants to try the SQ so she can give it herself .  Rx sent in; if not covered by insurance, will f/u with [facility].  Discussed birth spacing of 18 months  4. Sleep and fatigue -Encouraged family/partner/community support of 4 hrs of uninterrupted sleep to help with mood and fatigue  5. Physical Recovery  - Discussed patients delivery and complications - Patient had a N/A degree laceration, perineal healing reviewed. Patient expressed understanding - Patient has urinary incontinence? No Patient was referred to pelvic floor PT  - Patient is safe to resume physical and sexual activity  6.  Health Maintenance - Last pap smear done 01/2020 and was abnormal with LSIL with negative HPV.  REPEAT THIS APRIL 7. Chronic Disease - PCP follow up  02/2020, CNM [facility]

## 2020-10-19 ENCOUNTER — Ambulatory Visit: Payer: 59

## 2020-12-19 ENCOUNTER — Other Ambulatory Visit: Payer: Self-pay | Admitting: Advanced Practice Midwife

## 2020-12-19 MED ORDER — MEDROXYPROGESTERONE ACETATE 150 MG/ML IM SUSP: 150.0000 mg | INTRAMUSCULAR | 3 refills | Status: DC

## 2020-12-19 NOTE — Progress Notes (Signed)
Depo SQ not covered--will change to IM

## 2021-01-05 ENCOUNTER — Telehealth: Payer: Self-pay | Admitting: *Deleted

## 2021-01-05 NOTE — Telephone Encounter (Signed)
I called [facility] [address] and cancelled prescription for Depo Provera 104 mg. Advised to keep Depo Provera 150 mg. JSY

## 2021-01-09 ENCOUNTER — Other Ambulatory Visit: Payer: Self-pay

## 2021-01-09 ENCOUNTER — Ambulatory Visit (INDEPENDENT_AMBULATORY_CARE_PROVIDER_SITE_OTHER): Payer: 59 | Admitting: *Deleted

## 2021-01-09 DIAGNOSIS — Z3042 Encounter for surveillance of injectable contraceptive: Secondary | ICD-10-CM

## 2021-01-09 LAB — POCT URINE PREGNANCY: Preg Test, Ur: NEGATIVE

## 2021-01-09 MED ORDER — MEDROXYPROGESTERONE ACETATE 150 MG/ML IM SUSP
150.0000 mg | Freq: Once | INTRAMUSCULAR | Status: AC
  Administered 2021-01-09: 150 mg via INTRAMUSCULAR

## 2021-01-09 NOTE — Progress Notes (Signed)
   NURSE VISIT- INJECTION  SUBJECTIVE:  Carolyn Ellis is a 25 y.o. G34P1001 female here for a Depo Provera for contraception/period management. She is a GYN patient.   OBJECTIVE:  There were no vitals taken for this visit.  Appears well, in no apparent distress  Injection administered in: Right deltoid  Meds ordered this encounter  Medications  . medroxyPROGESTERone (DEPO-PROVERA) injection 150 mg    ASSESSMENT: GYN patient Depo Provera for contraception/period management PLAN: Follow-up: in 11-13 weeks for next Depo   Annamarie Dawley  01/09/2021 9:53 AM

## 2021-04-03 ENCOUNTER — Other Ambulatory Visit: Payer: Self-pay

## 2021-04-03 ENCOUNTER — Ambulatory Visit (INDEPENDENT_AMBULATORY_CARE_PROVIDER_SITE_OTHER): Payer: 59 | Admitting: *Deleted

## 2021-04-03 DIAGNOSIS — Z3042 Encounter for surveillance of injectable contraceptive: Secondary | ICD-10-CM | POA: Diagnosis not present

## 2021-04-03 MED ORDER — MEDROXYPROGESTERONE ACETATE 150 MG/ML IM SUSP
150.0000 mg | Freq: Once | INTRAMUSCULAR | Status: AC
  Administered 2021-04-03: 150 mg via INTRAMUSCULAR

## 2021-04-03 NOTE — Progress Notes (Signed)
   NURSE VISIT- INJECTION  SUBJECTIVE:  Carolyn Ellis is a 25 y.o. G73P1001 female here for a Depo Provera for contraception/period management. She is a GYN patient.   OBJECTIVE:  There were no vitals taken for this visit.  Appears well, in no apparent distress  Injection administered in: Left deltoid  Meds ordered this encounter  Medications  . medroxyPROGESTERone (DEPO-PROVERA) injection 150 mg    ASSESSMENT: GYN patient Depo Provera for contraception/period management PLAN: Follow-up: in 11-13 weeks for next Depo   Jobe Marker  04/03/2021 9:36 AM

## 2021-06-26 ENCOUNTER — Ambulatory Visit (INDEPENDENT_AMBULATORY_CARE_PROVIDER_SITE_OTHER): Payer: 59

## 2021-06-26 ENCOUNTER — Other Ambulatory Visit: Payer: Self-pay

## 2021-06-26 DIAGNOSIS — Z3042 Encounter for surveillance of injectable contraceptive: Secondary | ICD-10-CM | POA: Diagnosis not present

## 2021-06-26 MED ORDER — MEDROXYPROGESTERONE ACETATE 150 MG/ML IM SUSP
150.0000 mg | Freq: Once | INTRAMUSCULAR | Status: AC
  Administered 2021-06-26: 150 mg via INTRAMUSCULAR

## 2021-06-26 NOTE — Progress Notes (Signed)
   NURSE VISIT- INJECTION  SUBJECTIVE:  Carolyn Ellis is a 25 y.o. G17P1001 female here for a Depo Provera for contraception/period management. She is a GYN patient.   OBJECTIVE:  There were no vitals taken for this visit.  Appears well, in no apparent distress  Injection administered in: Right deltoid  Meds ordered this encounter  Medications   medroxyPROGESTERone (DEPO-PROVERA) injection 150 mg    ASSESSMENT: GYN patient Depo Provera for contraception/period management PLAN: Follow-up: in 11-13 weeks for next Depo   Ludean Duhart A Chelsey Kimberley  06/26/2021 9:41 AM

## 2021-09-18 ENCOUNTER — Other Ambulatory Visit: Payer: Self-pay

## 2021-09-18 ENCOUNTER — Ambulatory Visit (INDEPENDENT_AMBULATORY_CARE_PROVIDER_SITE_OTHER): Payer: 59

## 2021-09-18 DIAGNOSIS — Z3042 Encounter for surveillance of injectable contraceptive: Secondary | ICD-10-CM | POA: Diagnosis not present

## 2021-09-18 MED ORDER — MEDROXYPROGESTERONE ACETATE 150 MG/ML IM SUSP
150.0000 mg | Freq: Once | INTRAMUSCULAR | Status: AC
  Administered 2021-09-18: 150 mg via INTRAMUSCULAR

## 2021-09-18 NOTE — Progress Notes (Signed)
   NURSE VISIT- INJECTION  SUBJECTIVE:  Carolyn Ellis is a 25 y.o. G41P1001 female here for a Depo Provera for contraception/period management. She is a GYN patient.   OBJECTIVE:  There were no vitals taken for this visit.  Appears well, in no apparent distress  Injection administered in: Left deltoid  Meds ordered this encounter  Medications   medroxyPROGESTERone (DEPO-PROVERA) injection 150 mg    ASSESSMENT: GYN patient Depo Provera for contraception/period management PLAN: Follow-up: in 11-13 weeks for next Depo   Llewellyn Choplin A Eliyas Suddreth  09/18/2021 9:39 AM

## 2021-12-11 ENCOUNTER — Other Ambulatory Visit: Payer: Self-pay | Admitting: *Deleted

## 2021-12-11 ENCOUNTER — Other Ambulatory Visit: Payer: Self-pay | Admitting: Advanced Practice Midwife

## 2021-12-11 ENCOUNTER — Telehealth: Payer: Self-pay

## 2021-12-11 NOTE — Telephone Encounter (Signed)
Pt called and stated that she needs a refill for her Depo.  When she called her pharmacy they stated that nothing has been faxed over

## 2021-12-13 ENCOUNTER — Ambulatory Visit (INDEPENDENT_AMBULATORY_CARE_PROVIDER_SITE_OTHER): Payer: 59

## 2021-12-13 ENCOUNTER — Other Ambulatory Visit: Payer: Self-pay

## 2021-12-13 DIAGNOSIS — Z3042 Encounter for surveillance of injectable contraceptive: Secondary | ICD-10-CM

## 2021-12-13 MED ORDER — MEDROXYPROGESTERONE ACETATE 150 MG/ML IM SUSP
150.0000 mg | Freq: Once | INTRAMUSCULAR | Status: AC
  Administered 2021-12-13: 150 mg via INTRAMUSCULAR

## 2021-12-13 NOTE — Progress Notes (Signed)
° °  NURSE VISIT- INJECTION  SUBJECTIVE:  Carolyn Ellis is a 26 y.o. G53P1001 female here for a Depo Provera for contraception/period management. She is a GYN patient.   OBJECTIVE:  There were no vitals taken for this visit.  Appears well, in no apparent distress  Injection administered in: Right deltoid  Meds ordered this encounter  Medications   medroxyPROGESTERone (DEPO-PROVERA) injection 150 mg    ASSESSMENT: GYN patient Depo Provera for contraception/period management PLAN: Follow-up: in 11-13 weeks for next Depo   Krystianna Soth A Kaytlin Burklow  12/13/2021 9:36 AM

## 2022-03-05 ENCOUNTER — Ambulatory Visit (INDEPENDENT_AMBULATORY_CARE_PROVIDER_SITE_OTHER): Payer: 59 | Admitting: *Deleted

## 2022-03-05 DIAGNOSIS — Z3042 Encounter for surveillance of injectable contraceptive: Secondary | ICD-10-CM | POA: Diagnosis not present

## 2022-03-05 MED ORDER — MEDROXYPROGESTERONE ACETATE 150 MG/ML IM SUSP
150.0000 mg | Freq: Once | INTRAMUSCULAR | Status: AC
  Administered 2022-03-05: 150 mg via INTRAMUSCULAR

## 2022-03-05 NOTE — Progress Notes (Signed)
? ?  NURSE VISIT- INJECTION ? ?SUBJECTIVE:  ?Carolyn Ellis is a 26 y.o. G48P1001 female here for a Depo Provera for contraception/period management. She is a GYN patient.  ? ?OBJECTIVE:  ?There were no vitals taken for this visit.  ?Appears well, in no apparent distress ? ?Injection administered in: Left deltoid ? ?Meds ordered this encounter  ?Medications  ? medroxyPROGESTERone (DEPO-PROVERA) injection 150 mg  ? ? ?ASSESSMENT: ?GYN patient Depo Provera for contraception/period management ?PLAN: ?Follow-up: in 11-13 weeks for next Depo  ? ?Kristeen Miss Kolbi Altadonna  ?03/05/2022 ?11:01 AM ? ?

## 2022-05-28 ENCOUNTER — Ambulatory Visit: Payer: 59

## 2022-05-30 ENCOUNTER — Ambulatory Visit (INDEPENDENT_AMBULATORY_CARE_PROVIDER_SITE_OTHER): Payer: 59 | Admitting: *Deleted

## 2022-05-30 DIAGNOSIS — Z3042 Encounter for surveillance of injectable contraceptive: Secondary | ICD-10-CM

## 2022-05-30 MED ORDER — MEDROXYPROGESTERONE ACETATE 150 MG/ML IM SUSP
150.0000 mg | Freq: Once | INTRAMUSCULAR | Status: AC
  Administered 2022-05-30: 150 mg via INTRAMUSCULAR

## 2022-05-30 NOTE — Progress Notes (Unsigned)
   NURSE VISIT- INJECTION  SUBJECTIVE:  Carolyn Ellis is a 26 y.o. G4P1001 female here for a Depo Provera for contraception/period management. She is a GYN patient.   OBJECTIVE:  There were no vitals taken for this visit.  Appears well, in no apparent distress  Injection administered in: Left deltoid  Meds ordered this encounter  Medications   medroxyPROGESTERone (DEPO-PROVERA) injection 150 mg    ASSESSMENT: GYN patient Depo Provera for contraception/period management PLAN: Follow-up: in 11-13 weeks for next Depo   Annamarie Dawley  05/30/2022 3:55 PM

## 2022-08-22 ENCOUNTER — Ambulatory Visit: Payer: 59

## 2022-08-23 ENCOUNTER — Ambulatory Visit (INDEPENDENT_AMBULATORY_CARE_PROVIDER_SITE_OTHER): Payer: 59 | Admitting: *Deleted

## 2022-08-23 DIAGNOSIS — Z3042 Encounter for surveillance of injectable contraceptive: Secondary | ICD-10-CM | POA: Diagnosis not present

## 2022-08-23 MED ORDER — MEDROXYPROGESTERONE ACETATE 150 MG/ML IM SUSP
150.0000 mg | Freq: Once | INTRAMUSCULAR | Status: AC
  Administered 2022-08-23: 150 mg via INTRAMUSCULAR

## 2022-08-23 NOTE — Progress Notes (Signed)
   NURSE VISIT- INJECTION  SUBJECTIVE:  Carolyn Ellis is a 26 y.o. G28P1001 female here for a Depo Provera for contraception/period management. She is a GYN patient.   OBJECTIVE:  There were no vitals taken for this visit.  Appears well, in no apparent distress  Injection administered in: Right deltoid  No orders of the defined types were placed in this encounter.   ASSESSMENT: GYN patient Depo Provera for contraception/period management PLAN: Follow-up: in 11-13 weeks for next Depo   Carolyn Ellis  08/23/2022 4:09 PM

## 2022-10-31 ENCOUNTER — Other Ambulatory Visit: Payer: Self-pay | Admitting: Advanced Practice Midwife

## 2022-11-12 ENCOUNTER — Ambulatory Visit (INDEPENDENT_AMBULATORY_CARE_PROVIDER_SITE_OTHER): Payer: Managed Care, Other (non HMO) | Admitting: *Deleted

## 2022-11-12 DIAGNOSIS — Z3042 Encounter for surveillance of injectable contraceptive: Secondary | ICD-10-CM | POA: Diagnosis not present

## 2022-11-12 MED ORDER — MEDROXYPROGESTERONE ACETATE 150 MG/ML IM SUSP
150.0000 mg | Freq: Once | INTRAMUSCULAR | Status: AC
  Administered 2022-11-12: 150 mg via INTRAMUSCULAR

## 2022-11-12 NOTE — Progress Notes (Signed)
   NURSE VISIT- INJECTION  SUBJECTIVE:  Carolyn Ellis is a 27 y.o. G18P1001 female here for a Depo Provera for contraception/period management. She is a GYN patient.   OBJECTIVE:  There were no vitals taken for this visit.  Appears well, in no apparent distress  Injection administered in: Left deltoid  Meds ordered this encounter  Medications   medroxyPROGESTERone (DEPO-PROVERA) injection 150 mg    ASSESSMENT: GYN patient Depo Provera for contraception/period management PLAN: Follow-up: in 11-13 weeks for next Depo   Alice Rieger  11/12/2022 10:04 AM

## 2022-11-27 ENCOUNTER — Other Ambulatory Visit (INDEPENDENT_AMBULATORY_CARE_PROVIDER_SITE_OTHER): Payer: Managed Care, Other (non HMO)

## 2022-11-27 DIAGNOSIS — R300 Dysuria: Secondary | ICD-10-CM

## 2022-11-27 DIAGNOSIS — R350 Frequency of micturition: Secondary | ICD-10-CM

## 2022-11-27 DIAGNOSIS — R319 Hematuria, unspecified: Secondary | ICD-10-CM

## 2022-11-27 LAB — POCT URINALYSIS DIPSTICK OB
Glucose, UA: NEGATIVE
Ketones, UA: NEGATIVE
Nitrite, UA: NEGATIVE

## 2022-11-27 NOTE — Progress Notes (Signed)
   NURSE VISIT- UTI SYMPTOMS   SUBJECTIVE:  Carolyn Ellis is a 27 y.o. G33P1001 female here for UTI symptoms. She is a GYN patient. She reports dysuria, hematuria, and urinary frequency.  OBJECTIVE:  There were no vitals taken for this visit.  Appears well, in no apparent distress  Results for orders placed or performed in visit on 11/27/22 (from the past 24 hour(s))  POC Urinalysis Dipstick OB   Collection Time: 11/27/22 11:18 AM  Result Value Ref Range   Color, UA     Clarity, UA     Glucose, UA Negative Negative   Bilirubin, UA     Ketones, UA neg    Spec Grav, UA     Blood, UA moderate    pH, UA     POC,PROTEIN,UA Small (1+) Negative, Trace, Small (1+), Moderate (2+), Large (3+), 4+   Urobilinogen, UA     Nitrite, UA negative    Leukocytes, UA Trace (A) Negative   Appearance     Odor      ASSESSMENT: GYN patient with UTI symptoms and negative nitrites  PLAN: Note routed to Derrek Monaco, AGNP   Rx sent by provider today: No Urine culture sent Call or return to clinic prn if these symptoms worsen or fail to improve as anticipated. Follow-up: as needed   Alice Rieger  11/27/2022 11:18 AM

## 2022-11-28 LAB — URINALYSIS, ROUTINE W REFLEX MICROSCOPIC
Bilirubin, UA: NEGATIVE
Glucose, UA: NEGATIVE
Ketones, UA: NEGATIVE
Nitrite, UA: NEGATIVE
Specific Gravity, UA: 1.021 (ref 1.005–1.030)
Urobilinogen, Ur: 0.2 mg/dL (ref 0.2–1.0)
pH, UA: 6 (ref 5.0–7.5)

## 2022-11-28 LAB — MICROSCOPIC EXAMINATION: Casts: NONE SEEN /lpf

## 2022-11-29 ENCOUNTER — Telehealth: Payer: Self-pay

## 2022-11-29 LAB — URINE CULTURE: Organism ID, Bacteria: NO GROWTH

## 2022-11-29 NOTE — Telephone Encounter (Signed)
Patient would like a call about her test results. °

## 2022-11-29 NOTE — Telephone Encounter (Signed)
Returned patient's call. Informed urine culture negative.  Pt states she is feeling better.  Informed she is due for a pap smear so could call in the morning to get that scheduled. Pt verbalized understanding with no further questions.

## 2023-02-04 ENCOUNTER — Ambulatory Visit (INDEPENDENT_AMBULATORY_CARE_PROVIDER_SITE_OTHER): Payer: Managed Care, Other (non HMO) | Admitting: *Deleted

## 2023-02-04 DIAGNOSIS — Z3042 Encounter for surveillance of injectable contraceptive: Secondary | ICD-10-CM | POA: Diagnosis not present

## 2023-02-04 MED ORDER — MEDROXYPROGESTERONE ACETATE 150 MG/ML IM SUSP
150.0000 mg | Freq: Once | INTRAMUSCULAR | Status: AC
  Administered 2023-02-04: 150 mg via INTRAMUSCULAR

## 2023-02-04 NOTE — Progress Notes (Signed)
   NURSE VISIT- INJECTION  SUBJECTIVE:  Carolyn Ellis is a 27 y.o. G8P1001 female here for a Depo Provera for contraception/period management. She is a GYN patient.   OBJECTIVE:  There were no vitals taken for this visit.  Appears well, in no apparent distress  Injection administered in: Right deltoid  Meds ordered this encounter  Medications   medroxyPROGESTERone (DEPO-PROVERA) injection 150 mg    ASSESSMENT: GYN patient Depo Provera for contraception/period management PLAN: Follow-up: in 11-13 weeks for next Depo   Jobe Marker  02/04/2023 9:32 AM

## 2023-04-29 ENCOUNTER — Ambulatory Visit (INDEPENDENT_AMBULATORY_CARE_PROVIDER_SITE_OTHER): Payer: Managed Care, Other (non HMO) | Admitting: *Deleted

## 2023-04-29 DIAGNOSIS — Z3042 Encounter for surveillance of injectable contraceptive: Secondary | ICD-10-CM

## 2023-04-29 MED ORDER — MEDROXYPROGESTERONE ACETATE 150 MG/ML IM SUSP
150.0000 mg | Freq: Once | INTRAMUSCULAR | Status: AC
  Administered 2023-04-29: 150 mg via INTRAMUSCULAR

## 2023-04-29 NOTE — Progress Notes (Signed)
   NURSE VISIT- INJECTION  SUBJECTIVE:  Carolyn Ellis is a 27 y.o. G39P1001 female here for a Depo Provera for contraception/period management. She is a GYN patient.   OBJECTIVE:  There were no vitals taken for this visit.  Appears well, in no apparent distress  Injection administered in: Left deltoid  Meds ordered this encounter  Medications   medroxyPROGESTERone (DEPO-PROVERA) injection 150 mg    ASSESSMENT: GYN patient Depo Provera for contraception/period management PLAN: Follow-up: in 11-13 weeks for next Depo   Jobe Marker  04/29/2023 9:58 AM

## 2023-07-03 ENCOUNTER — Ambulatory Visit (INDEPENDENT_AMBULATORY_CARE_PROVIDER_SITE_OTHER): Payer: Managed Care, Other (non HMO) | Admitting: Obstetrics & Gynecology

## 2023-07-03 ENCOUNTER — Encounter: Payer: Self-pay | Admitting: Obstetrics & Gynecology

## 2023-07-03 ENCOUNTER — Other Ambulatory Visit (HOSPITAL_COMMUNITY)
Admission: RE | Admit: 2023-07-03 | Discharge: 2023-07-03 | Disposition: A | Discharge status: Home / Self Care | Payer: Managed Care, Other (non HMO) | Source: Ambulatory Visit | Attending: Obstetrics & Gynecology | Admitting: Obstetrics & Gynecology

## 2023-07-03 VITALS — BP 113/74 | HR 66 | Ht 65.0 in | Wt 199.8 lb

## 2023-07-03 DIAGNOSIS — Z3042 Encounter for surveillance of injectable contraceptive: Secondary | ICD-10-CM | POA: Diagnosis not present

## 2023-07-03 DIAGNOSIS — Z01419 Encounter for gynecological examination (general) (routine) without abnormal findings: Secondary | ICD-10-CM | POA: Diagnosis present

## 2023-07-03 MED ORDER — MEDROXYPROGESTERONE ACETATE 150 MG/ML IM SUSP
150.0000 mg | INTRAMUSCULAR | 4 refills | Status: AC
Start: 2023-07-03 — End: 2024-06-01

## 2023-07-03 NOTE — Progress Notes (Signed)
WELL-WOMAN EXAMINATION Patient name: Carolyn Ellis MRN [id number]  Date of birth: 02-Nov-1995 Chief Complaint:   Gynecologic Exam  History of Present Illness:   Carolyn Ellis is a 27 y.o. G92P1001 female being seen today for a routine well-woman exam.   Today she notes no acute complaints or concerns. On Depot and does not have a period- happy with this form of contraception.  Same partner.  Denies vaginal bleeding, discharge, itching or irritation. Denies pelvic or abdominal pain.  No other acute complaints.  No LMP recorded (lmp unknown). Patient has had an injection. Denies issues with her menses The current method of family planning is Depo-Provera injections.    Last pap 2021.  Last mammogram: NA. Last colonoscopy: NA     05/10/2020    9:37 AM 02/01/2020   11:59 AM 12/09/2019    2:16 PM  Depression screen PHQ 2/9  Decreased Interest 0 1 0  Down, Depressed, Hopeless 0 2 0  PHQ - 2 Score 0 3 0  Altered sleeping 0 2   Tired, decreased energy 1 3   Change in appetite 0 3   Feeling bad or failure about yourself  0 1   Trouble concentrating 1 0   Moving slowly or fidgety/restless 0 2   Suicidal thoughts 0 0   PHQ-9 Score 2 14   Difficult doing work/chores Somewhat difficult Somewhat difficult       Review of Systems:   Pertinent items are noted in HPI Denies any headaches, blurred vision, fatigue, shortness of breath, chest pain, abdominal pain, bowel movements, urination, or intercourse unless otherwise stated above.  Pertinent History Reviewed:  Reviewed past medical,surgical, social and family history.  Reviewed problem list, medications and allergies. Physical Assessment:   Vitals:   07/03/23 1531  BP: 113/74  Pulse: 66  Weight: 199 lb 12.8 oz (90.6 kg)  Height: 5\' 5"  (1.651 m)  Body mass index is 33.25 kg/m.        Physical Examination:   General appearance - well appearing, and in no distress  Mental status - alert, oriented to person, place, and  time  Psych:  She has a normal mood and affect  Skin - warm and dry, normal color, no suspicious lesions noted  Chest - effort normal, all lung fields clear to auscultation bilaterally  Heart - normal rate and regular rhythm  Neck:  midline trachea, no thyromegaly or nodules  Breasts - breasts appear normal, no suspicious masses, no skin or nipple changes or  axillary nodes  Abdomen - soft, nontender, nondistended, no masses or organomegaly  Pelvic - VULVA: normal appearing vulva with no masses, tenderness or lesions  VAGINA: normal appearing vagina with normal color and discharge, no lesions  CERVIX: normal appearing cervix without discharge or lesions, no CMT  Thin prep pap is done with HR HPV cotesting  UTERUS: uterus is felt to be normal size, shape, consistency and nontender   ADNEXA: No adnexal masses or tenderness noted.  Extremities:  No swelling or varicosities noted  Chaperone:  pt declined      Assessment & Plan:  1) Well-Woman Exam -pap collected, reviewed ASCCP guidelines  2) Contraceptive management -doing well with Depot and plan to continue  No orders of the defined types were placed in this encounter.   Meds:  Meds ordered this encounter  Medications   medroxyPROGESTERone (DEPO-PROVERA) 150 MG/ML injection    Sig: Inject 1 mL (150 mg total) into the muscle every 3 (three)  months.    Dispense:  1 mL    Refill:  4    Follow-up: Return in about 1 year (around 07/02/2024) for Annual and every 3 mos Depot.   Myna Hidalgo, DO Attending Obstetrician & Gynecologist, [facility] for [employer], [facility]

## 2023-07-09 LAB — CYTOLOGY - PAP
Adequacy: ABSENT
Chlamydia: NEGATIVE
Comment: NEGATIVE
Comment: NEGATIVE
Comment: NORMAL
Diagnosis: NEGATIVE
High risk HPV: NEGATIVE
Neisseria Gonorrhea: NEGATIVE

## 2023-07-22 ENCOUNTER — Ambulatory Visit (INDEPENDENT_AMBULATORY_CARE_PROVIDER_SITE_OTHER): Payer: Managed Care, Other (non HMO) | Admitting: *Deleted

## 2023-07-22 DIAGNOSIS — Z3042 Encounter for surveillance of injectable contraceptive: Secondary | ICD-10-CM

## 2023-07-22 MED ORDER — MEDROXYPROGESTERONE ACETATE 150 MG/ML IM SUSP
150.0000 mg | Freq: Once | INTRAMUSCULAR | Status: AC
Start: 2023-07-22 — End: 2023-07-22
  Administered 2023-07-22: 150 mg via INTRAMUSCULAR

## 2023-07-22 NOTE — Progress Notes (Signed)
   NURSE VISIT- INJECTION  SUBJECTIVE:  Carolyn Ellis is a 27 y.o. G65P1001 female here for a Depo Provera for contraception/period management. She is a GYN patient.   OBJECTIVE:  LMP  (LMP Unknown)   Appears well, in no apparent distress  Injection administered in: Right deltoid  Meds ordered this encounter  Medications   medroxyPROGESTERone (DEPO-PROVERA) injection 150 mg    ASSESSMENT: GYN patient Depo Provera for contraception/period management PLAN: Follow-up: in 11-13 weeks for next Depo   Jobe Marker  07/22/2023 10:21 AM

## 2023-10-14 ENCOUNTER — Ambulatory Visit: Payer: Managed Care, Other (non HMO)

## 2023-10-15 ENCOUNTER — Ambulatory Visit (INDEPENDENT_AMBULATORY_CARE_PROVIDER_SITE_OTHER): Payer: Managed Care, Other (non HMO)

## 2023-10-15 DIAGNOSIS — Z3042 Encounter for surveillance of injectable contraceptive: Secondary | ICD-10-CM

## 2023-10-15 MED ORDER — MEDROXYPROGESTERONE ACETATE 150 MG/ML IM SUSY
150.0000 mg | PREFILLED_SYRINGE | Freq: Once | INTRAMUSCULAR | Status: AC
Start: 2023-10-15 — End: 2023-10-15
  Administered 2023-10-15: 150 mg via INTRAMUSCULAR

## 2023-10-15 NOTE — Progress Notes (Signed)
   NURSE VISIT- INJECTION  SUBJECTIVE:  Carolyn Ellis is a 27 y.o. G66P1001 female here for a Depo Provera for contraception/period management. She is a GYN patient.   OBJECTIVE:  There were no vitals taken for this visit.  Appears well, in no apparent distress  Injection administered in: Left deltoid  Meds ordered this encounter  Medications   medroxyPROGESTERone Acetate SUSY 150 mg    ASSESSMENT: GYN patient Depo Provera for contraception/period management PLAN: Follow-up: in 11-13 weeks for next Depo   Carolyn Ellis  10/15/2023 8:48 AM

## 2023-12-10 ENCOUNTER — Ambulatory Visit
Admission: EM | Admit: 2023-12-10 | Discharge: 2023-12-10 | Disposition: A | Discharge status: Home / Self Care | Payer: BC Managed Care – PPO | Attending: Nurse Practitioner | Admitting: Nurse Practitioner

## 2023-12-10 ENCOUNTER — Encounter: Payer: Self-pay | Admitting: Emergency Medicine

## 2023-12-10 DIAGNOSIS — R590 Localized enlarged lymph nodes: Secondary | ICD-10-CM | POA: Insufficient documentation

## 2023-12-10 DIAGNOSIS — J029 Acute pharyngitis, unspecified: Secondary | ICD-10-CM | POA: Insufficient documentation

## 2023-12-10 LAB — POC COVID19/FLU A&B COMBO
Covid Antigen, POC: NEGATIVE
Influenza A Antigen, POC: NEGATIVE
Influenza B Antigen, POC: NEGATIVE

## 2023-12-10 LAB — POCT RAPID STREP A (OFFICE): Rapid Strep A Screen: NEGATIVE

## 2023-12-10 MED ORDER — LIDOCAINE VISCOUS HCL 2 % MT SOLN: 15.0000 mL | OROMUCOSAL | 0 refills | Status: AC | PRN

## 2023-12-10 MED ORDER — AZITHROMYCIN 500 MG PO TABS: 500.0000 mg | ORAL_TABLET | Freq: Every day | ORAL | 0 refills | Status: AC

## 2023-12-10 NOTE — ED Provider Notes (Signed)
[facility]    CSN: [id number] Arrival date & time: 12/10/23  1240      History   Chief Complaint No chief complaint on file.   HPI Carolyn Ellis is a 28 y.o. female.   Patient presents today with 2-day history of bodyaches, chills, sore throat, headache, ear pain, nausea with 1 episode of vomiting, decreased appetite, and fatigue.  No cough, runny or stuffy nose, abdominal pain, diarrhea.  She denies any nausea or abdominal pain currently.  Reports her father has been sick with similar symptoms.  Has tried Mucinex and over-the-counter Aleve for symptoms with minimal temporary improvement.    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 Authorizing Provider  azithromycin (ZITHROMAX) 500 MG tablet Take 1 tablet (500 mg total) by mouth daily for 5 days. 12/10/23 12/15/23 Yes Cathlean Marseilles A, NP  lidocaine (XYLOCAINE) 2 % solution Use as directed 15 mLs in the mouth or throat as needed for mouth pain. Gargle and spit as needed for throat pain. 12/10/23  Yes Cathlean Marseilles A, NP  lisdexamfetamine (VYVANSE) 10 MG capsule Take 10 mg by mouth every morning. Patient not taking: Reported on 10/15/2023 08/12/23   [provider]  lisdexamfetamine (VYVANSE) 40 MG capsule Take 40 mg by mouth every  morning. 09/27/23   [provider]  medroxyPROGESTERone (DEPO-PROVERA) 150 MG/ML injection Inject 1 mL (150 mg total) into the muscle every 3 (three) months. 07/03/23 10/01/23  Myna Hidalgo, DO    Family History Family History  Problem Relation Age of Onset   Healthy Mother    Multiple sclerosis Mother    Heart attack Maternal Grandfather    Stroke Paternal Grandfather    Cancer Maternal Grandmother    Hypertension Father    Drug abuse Paternal Aunt     Social History Social History   Tobacco Use   Smoking status: Never   Smokeless tobacco: Never  Vaping Use   Vaping status: Never Used  Substance Use Topics   Alcohol use: No    Alcohol/week: 0.0 standard drinks of alcohol   Drug use: No     Allergies   Unasyn [ampicillin-sulbactam sodium], Adderall [amphetamine-dextroamphetamine], Augmentin [amoxicillin-pot clavulanate], and Penicillins   Review of Systems Review of Systems Per HPI  Physical Exam Triage Vital Signs ED Triage Vitals  Encounter Vitals Group     BP 12/10/23 1308 109/72     Systolic BP Percentile --  Diastolic BP Percentile --      Pulse Rate 12/10/23 1308 (!) 105     Resp 12/10/23 1308 18     Temp 12/10/23 1308 98.3 F (36.8 C)     Temp Source 12/10/23 1308 Oral     SpO2 12/10/23 1308 98 %     Weight --      Height --      Head Circumference --      Peak Flow --      Pain Score 12/10/23 1309 10     Pain Loc --      Pain Education --      Exclude from Growth Chart --    No data found.  Updated Vital Signs BP 109/72 (BP Location: Right Arm)   Pulse (!) 105   Temp 98.3 F (36.8 C) (Oral)   Resp 18   SpO2 98%   Visual Acuity Right Eye Distance:   Left Eye Distance:   Bilateral Distance:    Right Eye Near:   Left Eye Near:    Bilateral Near:     Physical Exam Vitals and nursing note reviewed.  Constitutional:      General: She is not in acute distress.    Appearance: She is well-developed. She is not  toxic-appearing.  HENT:     Head: Normocephalic and atraumatic.     Right Ear: Tympanic membrane, ear canal and external ear normal. No drainage or swelling. No middle ear effusion. There is no impacted cerumen. Tympanic membrane is not erythematous.     Left Ear: Tympanic membrane, ear canal and external ear normal. No drainage, swelling or tenderness.  No middle ear effusion. There is no impacted cerumen. Tympanic membrane is not erythematous.     Nose: No congestion or rhinorrhea.     Mouth/Throat:     Mouth: Mucous membranes are moist.     Pharynx: Oropharynx is clear. Uvula midline. Posterior oropharyngeal erythema present. No oropharyngeal exudate or uvula swelling.     Tonsils: No tonsillar exudate. 1+ on the right. 1+ on the left.  Eyes:     Extraocular Movements:     Right eye: Normal extraocular motion.     Left eye: Normal extraocular motion.  Cardiovascular:     Rate and Rhythm: Normal rate and regular rhythm.  Pulmonary:     Effort: Pulmonary effort is normal. No respiratory distress.     Breath sounds: Normal breath sounds. No wheezing, rhonchi or rales.  Musculoskeletal:     Cervical back: Normal range of motion and neck supple.  Lymphadenopathy:     Cervical: Cervical adenopathy present.  Skin:    General: Skin is warm and dry.     Capillary Refill: Capillary refill takes less than 2 seconds.     Coloration: Skin is not pale.     Findings: No erythema or rash.  Neurological:     Mental Status: She is alert and oriented to person, place, and time.  Psychiatric:        Behavior: Behavior is cooperative.      UC Treatments / Results  Labs (all labs ordered are listed, but only abnormal results are displayed) Labs Reviewed  POCT RAPID STREP A (OFFICE) - Normal  POC COVID19/FLU A&B COMBO - Normal  CULTURE, GROUP A STREP [facility])    EKG   Radiology No results found.  Procedures Procedures (including critical care time)  Medications Ordered in  UC Medications - No data to display  Initial Impression /  Assessment and Plan / UC Course  I have reviewed the triage vital signs and the nursing notes.  Pertinent labs & imaging results that were available during my care of the patient were reviewed by me and considered in my medical decision making (see chart for details).   In triage, patient is mildly tachycardic, otherwise vital signs are stable.  1. Acute pharyngitis, unspecified etiology 2. Cervical lymphadenopathy Rapid strep negative COVID-19, influenza testing is also negative Given examination, throat culture was performed I am highly suspicious for strep throat, given allergy to penicillins, will treat with azithromycin 500 mg daily for 5 days Other supportive care discussed including change toothbrush after starting treatment, lidocaine rinses, continue Aleve/Tylenol as needed for pain Return and ER precautions discussed with patient Work excuse provided  The patient was given the opportunity to ask questions.  All questions answered to their satisfaction.  The patient is in agreement to this plan.    Final Clinical Impressions(s) / UC Diagnoses   Final diagnoses:  Acute pharyngitis, unspecified etiology  Cervical lymphadenopathy     Discharge Instructions      The rapid strep throat test is negative; the COVID-19 and influenza test are negative today.  I believe you have strep throat based on your exam today.  Take azithromycin as prescribed to treat it.  You can continue Aleve/Tylenol for body pain or fever.  Start Lidocaine rinses and throat lozenges to help with sore throat.  Change toothbrush today or tomorrow to prevent reinfection.  You are considered contagious until you have been on the antibiotic for 24 hours.     ED Prescriptions     Medication Sig Dispense Auth. Provider   azithromycin (ZITHROMAX) 500 MG tablet Take 1 tablet (500 mg total) by mouth daily for 5 days. 5 tablet Cathlean Marseilles A, NP    lidocaine (XYLOCAINE) 2 % solution Use as directed 15 mLs in the mouth or throat as needed for mouth pain. Gargle and spit as needed for throat pain. 100 mL Valentino Nose, NP      PDMP not reviewed this encounter.   Valentino Nose, NP 12/10/23 1517

## 2023-12-10 NOTE — Discharge Instructions (Addendum)
The rapid strep throat test is negative; the COVID-19 and influenza test are negative today.  I believe you have strep throat based on your exam today.  Take azithromycin as prescribed to treat it.  You can continue Aleve/Tylenol for body pain or fever.  Start Lidocaine rinses and throat lozenges to help with sore throat.  Change toothbrush today or tomorrow to prevent reinfection.  You are considered contagious until you have been on the antibiotic for 24 hours.

## 2023-12-10 NOTE — ED Triage Notes (Signed)
Body aches, headache, sore throat since Sunday.

## 2023-12-13 LAB — CULTURE, GROUP A STREP (THRC)

## 2024-01-07 ENCOUNTER — Ambulatory Visit: Payer: Managed Care, Other (non HMO) | Admitting: *Deleted

## 2024-01-07 DIAGNOSIS — Z3042 Encounter for surveillance of injectable contraceptive: Secondary | ICD-10-CM | POA: Diagnosis not present

## 2024-01-07 MED ORDER — MEDROXYPROGESTERONE ACETATE 150 MG/ML IM SUSY
150.0000 mg | PREFILLED_SYRINGE | Freq: Once | INTRAMUSCULAR | Status: AC
  Administered 2024-01-07: 150 mg via INTRAMUSCULAR

## 2024-01-07 NOTE — Progress Notes (Signed)
   NURSE VISIT- INJECTION  SUBJECTIVE:  Carolyn Ellis is a 28 y.o. G55P1001 female here for a Depo Provera for contraception/period management. She is a GYN patient.   OBJECTIVE:  There were no vitals taken for this visit.  Appears well, in no apparent distress  Injection administered in: Right deltoid  Meds ordered this encounter  Medications   medroxyPROGESTERone Acetate SUSY 150 mg    ASSESSMENT: GYN patient Depo Provera for contraception/period management PLAN: Follow-up: in 11-13 weeks for next Depo   Annamarie Dawley  01/07/2024 9:53 AM

## 2024-03-31 ENCOUNTER — Ambulatory Visit

## 2024-03-31 DIAGNOSIS — Z3042 Encounter for surveillance of injectable contraceptive: Secondary | ICD-10-CM

## 2024-03-31 MED ORDER — MEDROXYPROGESTERONE ACETATE 150 MG/ML IM SUSY
150.0000 mg | PREFILLED_SYRINGE | Freq: Once | INTRAMUSCULAR | Status: AC
  Administered 2024-03-31: 150 mg via INTRAMUSCULAR

## 2024-03-31 NOTE — Progress Notes (Signed)
   NURSE VISIT- INJECTION  SUBJECTIVE:  Carolyn Ellis is a 28 y.o. G21P1001 female here for a Depo Provera  for contraception/period management. She is a GYN patient.   OBJECTIVE:  There were no vitals taken for this visit.  Appears well, in no apparent distress  Injection administered in: Left deltoid  Meds ordered this encounter  Medications   medroxyPROGESTERone  Acetate SUSY 150 mg    ASSESSMENT: GYN patient Depo Provera  for contraception/period management PLAN: Follow-up: in 11-13 weeks for next Depo   [name]  03/31/2024 9:33 AM

## 2024-05-29 ENCOUNTER — Telehealth: Payer: Self-pay

## 2024-05-29 NOTE — Telephone Encounter (Signed)
 Spoke with pt and she does not use CPAP or had a hst

## 2024-06-01 ENCOUNTER — Ambulatory Visit (INDEPENDENT_AMBULATORY_CARE_PROVIDER_SITE_OTHER): Payer: Self-pay | Admitting: Nurse Practitioner

## 2024-06-01 ENCOUNTER — Encounter: Payer: Self-pay | Admitting: Nurse Practitioner

## 2024-06-01 VITALS — BP 116/69 | HR 92 | Ht 65.0 in | Wt 162.6 lb

## 2024-06-01 DIAGNOSIS — G4719 Other hypersomnia: Secondary | ICD-10-CM | POA: Insufficient documentation

## 2024-06-01 DIAGNOSIS — G4700 Insomnia, unspecified: Secondary | ICD-10-CM | POA: Insufficient documentation

## 2024-06-01 DIAGNOSIS — R0683 Snoring: Secondary | ICD-10-CM | POA: Insufficient documentation

## 2024-06-01 NOTE — Assessment & Plan Note (Signed)
 See above

## 2024-06-01 NOTE — Patient Instructions (Signed)
 Given your symptoms, I am concerned that you may have sleep disordered breathing with sleep apnea. You will need a sleep study for further evaluation. Someone will contact you to schedule this.   We discussed how untreated sleep apnea puts an individual at risk for cardiac arrhthymias, pulm HTN, DM, stroke and increases their risk for daytime accidents. We also briefly reviewed treatment options including weight loss, side sleeping position, oral appliance, CPAP therapy or referral to ENT for possible surgical options  Use caution when driving and pull over if you become sleepy.  Follow up in 6-8 weeks with Katie Caterra Ostroff,NP to go over sleep study results, or sooner, if needed. Friday PM virtual clinic preferred

## 2024-06-01 NOTE — Assessment & Plan Note (Signed)
 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 Need to rule out OSA given hx of snoring and class III airway. See above. If no significant OSA, recommend trial of pharmacological therapy. Sleep hygiene reviewed. Continue management for anxiety with PCP.

## 2024-06-01 NOTE — Progress Notes (Signed)
 @Patient  ID: Carolyn Ellis, female    DOB: 02-26-1996, 28 y.o.   MRN: [id number]  Chief Complaint  Patient presents with   Establish Care    Referring provider: Teresa Jenkins Jansky, FNP  HPI: 28 year old female, never smoker referred for sleep consult. Past medical history significant for anxiety, ADD.   TEST/EVENTS:   06/01/2024: Today - sleep consult Discussed the use of AI scribe software for clinical note transcription with the patient, who gave verbal consent to proceed.  History of Present Illness Carolyn Ellis is a 28 year old female who presents for a sleep consult. She was referred by her primary care doctor for a sleep consult.  She has experienced difficulty with sleep for approximately three years, coinciding with the birth of her child. The primary issue is staying asleep. Doesn't usually have trouble falling asleep. No history of snoring, choking, gasping for air, or being told she stops breathing during sleep. She feels tired during the day but denies drowsy driving. No sleep parasomnias/paralysis.   She has a history of anxiety and ADD, managed with Pristiq and Vyvanse. Pristiq is taken in the morning, but Vyvanse has not been taken since quitting her job. She has not used prescribed sleep medications but has used Nyquil to aid sleep. She consumes caffeine, including one Red Bull in AM, but denies alcohol intake.   Her weight has remained stable since the birth of her child. She is not currently working and is seeking employment. She has a three-year-old son.   She goes to bed around 10 pm. Falls asleep quickly, usually. Doesn't seem to stay asleep for longer than 4 hours then up 2-3 times. Gets up around 8-9 am. Lives with her brother and son.  Epworth 5    Allergies  Allergen Reactions   Unasyn  [Ampicillin -Sulbactam Sodium ] Rash   Adderall [Amphetamine-Dextroamphetamine] Other (See Comments)    Hyperactivity and irritability   Augmentin [Amoxicillin-Pot Clavulanate]  Rash    Ampicillin  tolerated during 10/6 admission with no AE   Penicillins Rash    Ampicillin  tolerated during 10/6 admission with no AE    Immunization History  Administered Date(s) Administered   Tdap 06/07/2020    Past Medical History:  Diagnosis Date   Abnormal Pap smear of cervix    ASCUS    Tobacco History: Social History   Tobacco Use  Smoking Status Never  Smokeless Tobacco Never   Counseling given: Not Answered   Outpatient Medications Prior to Visit  Medication Sig Dispense Refill   desvenlafaxine (PRISTIQ) 50 MG 24 hr tablet Take 25 mg by mouth daily.     lisdexamfetamine (VYVANSE) 40 MG capsule Take 40 mg by mouth every morning.     medroxyPROGESTERone  (DEPO-PROVERA ) 150 MG/ML injection Inject 1 mL (150 mg total) into the muscle every 3 (three) months. 1 mL 4   lidocaine  (XYLOCAINE ) 2 % solution Use as directed 15 mLs in the mouth or throat as needed for mouth pain. Gargle and spit as needed for throat pain. 100 mL 0   lisdexamfetamine (VYVANSE) 10 MG capsule Take 10 mg by mouth every morning. (Patient not taking: Reported on 10/15/2023)     No facility-administered medications prior to visit.     Review of Systems:   Constitutional: No weight loss or gain, night sweats, fevers, chills, or lassitude. +fatigue  HEENT: No headaches CV:  No chest pain, orthopnea, PND, palpitations Resp: +snoring GI:  No heartburn, indigestion  GU: No nocturia  Psych: +stable  depression, anxiety. Mood stable. +sleep disturbance     Physical Exam:  BP 116/69   Pulse 92   Ht 5' 5 (1.651 m)   Wt 162 lb 9.6 oz (73.8 kg)   SpO2 99% Comment: ra  BMI 27.06 kg/m   GEN: Pleasant, interactive, well-appearing; in no acute distress HEENT:  Normocephalic and atraumatic. PERRLA. Sclera white. Nasal turbinates pink, moist and patent bilaterally. No rhinorrhea present. Oropharynx pink and moist, without exudate or edema. No lesions, ulcerations, or postnasal drip. Mallampati  III NECK:  Supple w/ fair ROM. Thyroid symmetrical with no goiter or nodules palpated. No lymphadenopathy.   CV: RRR, no m/r/g. Pulses intact, +2 bilaterally.  PULMONARY:  Unlabored, regular breathing. Clear bilaterally A&P w/o wheezes/rales/rhonchi.  GI: BS present and normoactive. Soft, non-tender to palpation. No organomegaly or masses detected.  MSK: No erythema, warmth or tenderness. Cap refil <2 sec all extrem.  Neuro: A/Ox3. No focal deficits noted.   Skin: Warm, no lesions or rashe Psych: Normal affect and behavior. Judgement and thought content appropriate.     Lab Results:  CBC    Component Value Date/Time   WBC 26.4 (H) 08/05/2020 0526   RBC 3.21 (L) 08/05/2020 0526   HGB 7.8 (L) 08/05/2020 0526   HGB 9.4 (L) 06/28/2020 1646   HCT 25.8 (L) 08/05/2020 0526   HCT 29.0 (L) 06/28/2020 1646   PLT 198 08/05/2020 0526   PLT 228 06/28/2020 1646   MCV 80.4 08/05/2020 0526   MCV 78 (L) 06/28/2020 1646   MCH 24.3 (L) 08/05/2020 0526   MCHC 30.2 08/05/2020 0526   RDW 17.2 (H) 08/05/2020 0526   RDW 13.7 06/28/2020 1646   LYMPHSABS 2.0 02/01/2020 1228   EOSABS 0.1 02/01/2020 1228   BASOSABS 0.0 02/01/2020 1228    BMET    Component Value Date/Time   NA 135 08/04/2020 1523   K 3.3 (L) 08/04/2020 1523   CL 105 08/04/2020 1523   CO2 18 (L) 08/04/2020 1523   GLUCOSE 106 (H) 08/04/2020 1523   BUN 7 08/04/2020 1523   CREATININE 0.95 08/04/2020 1523   CALCIUM 8.3 (L) 08/04/2020 1523   GFRNONAA >60 08/04/2020 1523    BNP No results found for: BNP   Imaging:  No results found.  Administration History     None           No data to display          No results found for: NITRICOXIDE      Assessment & Plan:   Excessive daytime sleepiness She has snoring, excessive daytime sleepiness, restless sleep. BMI 27. Given this,  I am concerned she could have sleep disordered breathing with obstructive sleep apnea. She will need sleep study for further  evaluation.    - discussed how weight can impact sleep and risk for sleep disordered breathing - discussed options to assist with weight loss: combination of diet modification, cardiovascular and strength training exercises   - had an extensive discussion regarding the adverse health consequences related to untreated sleep disordered breathing - specifically discussed the risks for hypertension, coronary artery disease, cardiac dysrhythmias, cerebrovascular disease, and diabetes - lifestyle modification discussed   - discussed how sleep disruption can increase risk of accidents, particularly when driving - safe driving practices were discussed  Patient Instructions  Given your symptoms, I am concerned that you [place] have sleep disordered breathing with sleep apnea. You will need a sleep study for further evaluation. Someone will contact you to schedule  this.   We discussed how untreated sleep apnea puts an individual at risk for cardiac arrhthymias, pulm HTN, DM, stroke and increases their risk for daytime accidents. We also briefly reviewed treatment options including weight loss, side sleeping position, oral appliance, CPAP therapy or referral to ENT for possible surgical options  Use caution when driving and pull over if you become sleepy.  Follow up in 6-8 weeks with Katie Eligh Rybacki,NP to go over sleep study results, or sooner, if needed. 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 Need to rule out OSA given hx of snoring and class III airway. See above. If no significant OSA, recommend trial of pharmacological therapy. Sleep hygiene reviewed. Continue management for anxiety with PCP.   Advised if symptoms do not improve or worsen, to please contact office for sooner follow up or seek emergency care.   I spent 35 minutes of dedicated to the care of this patient on the date of this encounter to include pre-visit  review of records, face-to-face time with the patient discussing conditions above, post visit ordering of testing, clinical documentation with the electronic health record, making appropriate referrals as documented, and communicating necessary findings to members of the patients care team.  Comer LULLA Rouleau, NP 06/01/2024  Pt aware and understands NP's role.

## 2024-06-01 NOTE — Assessment & Plan Note (Signed)
 She has snoring, excessive daytime sleepiness, restless sleep. BMI 27. Given this,  I am concerned she could have sleep disordered breathing with obstructive sleep apnea. She will need sleep study for further evaluation.    - discussed how weight can impact sleep and risk for sleep disordered breathing - discussed options to assist with weight loss: combination of diet modification, cardiovascular and strength training exercises   - had an extensive discussion regarding the adverse health consequences related to untreated sleep disordered breathing - specifically discussed the risks for hypertension, coronary artery disease, cardiac dysrhythmias, cerebrovascular disease, and diabetes - lifestyle modification discussed   - discussed how sleep disruption can increase risk of accidents, particularly when driving - safe driving practices were discussed  Patient Instructions  Given your symptoms, I am concerned that you [place] have sleep disordered breathing with sleep apnea. You will need a sleep study for further evaluation. Someone will contact you to schedule this.   We discussed how untreated sleep apnea puts an individual at risk for cardiac arrhthymias, pulm HTN, DM, stroke and increases their risk for daytime accidents. We also briefly reviewed treatment options including weight loss, side sleeping position, oral appliance, CPAP therapy or referral to ENT for possible surgical options  Use caution when driving and pull over if you become sleepy.  Follow up in 6-8 weeks with Katie Suhail Peloquin,NP to go over sleep study results, or sooner, if needed. Friday PM virtual clinic preferred

## 2024-06-23 ENCOUNTER — Ambulatory Visit (INDEPENDENT_AMBULATORY_CARE_PROVIDER_SITE_OTHER): Payer: Self-pay

## 2024-06-23 DIAGNOSIS — Z3042 Encounter for surveillance of injectable contraceptive: Secondary | ICD-10-CM

## 2024-06-23 MED ORDER — MEDROXYPROGESTERONE ACETATE 150 MG/ML IM SUSY
150.0000 mg | PREFILLED_SYRINGE | Freq: Once | INTRAMUSCULAR | Status: AC
  Administered 2024-06-23: 150 mg via INTRAMUSCULAR

## 2024-06-23 NOTE — Progress Notes (Signed)
   NURSE VISIT- INJECTION  SUBJECTIVE:  Carolyn Ellis is a 28 y.o. G51P1001 female here for a Depo Provera  for contraception/period management. She is a GYN patient.   OBJECTIVE:  There were no vitals taken for this visit.  Appears well, in no apparent distress  Injection administered in: Right deltoid  No orders of the defined types were placed in this encounter.   ASSESSMENT: GYN patient Depo Provera  for contraception/period management PLAN: Follow-up: in 11-13 weeks for next Depo   Alan LITTIE Fischer  06/23/2024 9:34 AM

## 2024-07-01 ENCOUNTER — Encounter: Payer: Self-pay | Admitting: Internal Medicine

## 2024-07-08 ENCOUNTER — Telehealth: Payer: Self-pay | Admitting: Nurse Practitioner

## 2024-07-08 NOTE — Telephone Encounter (Signed)
 Copied from CRM [id number]. Topic: Appointments - Scheduling Inquiry for Clinic >> Jul 08, 2024  9:57 AM Lavanda D wrote: Reason for CRM: Patient looking to schedule a virtual follow-up with Comer Rouleau to go over sleep study results but no templates available at the moment, please call patient to verify that this provider is available/what date.   ----------------------------------------------------------------------- From previous Reason for Contact - Scheduling: Patient/patient representative is calling to schedule an appointment. Refer to attachments for appointment information.  Spoke with patient--she is scheduled fro My Chart visit Friday 07/10/24 at 3:00pm and the patient is aware

## 2024-07-10 ENCOUNTER — Telehealth (INDEPENDENT_AMBULATORY_CARE_PROVIDER_SITE_OTHER): Admitting: Nurse Practitioner

## 2024-07-10 ENCOUNTER — Encounter: Payer: Self-pay | Admitting: Nurse Practitioner

## 2024-07-10 DIAGNOSIS — F5101 Primary insomnia: Secondary | ICD-10-CM

## 2024-07-10 DIAGNOSIS — G4719 Other hypersomnia: Secondary | ICD-10-CM

## 2024-07-10 DIAGNOSIS — R0683 Snoring: Secondary | ICD-10-CM | POA: Diagnosis not present

## 2024-07-10 MED ORDER — ZOLPIDEM TARTRATE 5 MG PO TABS: 5.0000 mg | ORAL_TABLET | Freq: Every evening | ORAL | 0 refills | Status: DC | PRN

## 2024-07-10 NOTE — Assessment & Plan Note (Signed)
 She has snoring, excessive daytime sleepiness, restless sleep. BMI 27. HST was inconclusive without adequate amount of data. I am still concerned she could have sleep disordered breathing with obstructive sleep apnea. She will need an in lab sleep study for further evaluation.    - discussed how weight can impact sleep and risk for sleep disordered breathing - discussed options to assist with weight loss: combination of diet modification, cardiovascular and strength training exercises   - had an extensive discussion regarding the adverse health consequences related to untreated sleep disordered breathing - specifically discussed the risks for hypertension, coronary artery disease, cardiac dysrhythmias, cerebrovascular disease, and diabetes - lifestyle modification discussed   - discussed how sleep disruption can increase risk of accidents, particularly when driving - safe driving practices were discussed  Patient Instructions  Sleep study at home did not have enough time to truly rule out or in sleep apnea. You did have some mild events, but not enough for insurance to approve therapy for. I recommend doing an in lab study for further evaluation. I have ordered this and someone should reach out in the next 6 weeks to schedule it. If not, please call my office  Start ambien  (zolpidem ) 5 mg At bedtime as needed for sleep. Take immediately before bed. Ensure you have 7-8 hours in the bed after taking. Monitor for any mood changes or changes in sleep habits. Stop and notify immediately if these occur. Do not drive or operate heavy machinery after taking. Do not take with alcohol or other sedating medications. Heiser cause some morning grogginess or vivid dreams.  Ok to take the night of your study   Follow up 12/5 at 130pm with Katie Alithia Zavaleta,NP to review sleep study results and follow up on medication. If symptoms do not improve or worsen, please contact office for sooner follow up or seek emergency care.

## 2024-07-10 NOTE — Progress Notes (Signed)
 Patient ID: Carolyn Ellis, female     DOB: [birth year], 28 y.o.      MRN: [id number]  No chief complaint on file.   Virtual Visit via Video Note  I connected with Carolyn Ellis on 07/10/24 at  3:00 PM EDT by a video enabled telemedicine application and verified that I am speaking with the correct person using two identifiers.  Location: Patient: Home Provider: Office   I discussed the limitations of evaluation and management by telemedicine and the availability of in person appointments. The patient expressed understanding and agreed to proceed.  History of Present Illness: 28 year old female, never smoker followed for insomnia and excessive daytime sleepiness. Past medical history significant for anxiety, ADD.    TEST/EVENTS:  06/12/2024 HST: AHI 3% 10.7/h, SpO2 low 93%  06/01/2024: OV with Shavette Shoaff NP [name] Carolyn Ellis is a 28 year old female who presents for a sleep consult. She was referred by her primary care doctor for a sleep consult. She has experienced difficulty with sleep for approximately three years, coinciding with the birth of her child. The primary issue is staying asleep. Doesn't usually have trouble falling asleep. No history of snoring, choking, gasping for air, or being told she stops breathing during sleep. She feels tired during the day but denies drowsy driving. No sleep parasomnias/paralysis.  She has a history of anxiety and ADD, managed with Pristiq and Vyvanse. Pristiq is taken in the morning, but Vyvanse has not been taken since quitting her job. She has not used prescribed sleep medications but has used Nyquil to aid sleep. She consumes caffeine, including one Red Bull in AM, but denies alcohol intake.  Her weight has remained stable since the birth of her child. She is not currently working and is seeking employment. She has a three-year-old son.  She goes to bed around 10 pm. Falls asleep quickly, usually. Doesn't seem to stay asleep for longer than 4 hours then up 2-3  times. Gets up around 8-9 am. Lives with her brother and son. Epworth 5  07/10/2024: Today - follow up Discussed the use of AI scribe software for clinical note transcription with the patient, who gave verbal consent to proceed.  History of Present Illness Carolyn Ellis is a 28 year old female who presents with sleep disturbances.  She experiences difficulty staying asleep at night and feels tired during the day. These symptoms have been ongoing for a while and worsened after the birth of her son.  A home sleep study only obtained 90 minutes of data to be analyzed. She had a 4% score without significant OSA but her 3% scoring showed mild OSA. Her oxygen levels did not drop significantly during the study. She felt like she kept waking up with the machine and struggled to make it through the night without taking it off.   She continues to have disruptions in night time sleep and struggles to sleep for long stretches. She has tried over-the-counter sleep aids, including a 25 mg [employer] brand sleep medication and a nighttime cough medicine, which provided temporary relief but did not sustain sleep throughout the night.  No history of sleepwalking. No mood changes. No drowsy driving. No significant changes from prior OV.     Allergies  Allergen Reactions   Unasyn  [Ampicillin -Sulbactam Sodium ] Rash   Adderall [Amphetamine-Dextroamphetamine] Other (See Comments)    Hyperactivity and irritability   Augmentin [Amoxicillin-Pot Clavulanate] Rash    Ampicillin  tolerated during 10/6 admission with no AE  Penicillins Rash    Ampicillin  tolerated during 10/6 admission with no AE   Immunization History  Administered Date(s) Administered   Tdap 06/07/2020   Past Medical History:  Diagnosis Date   Abnormal Pap smear of cervix    ASCUS    Tobacco History: Social History   Tobacco Use  Smoking Status Never  Smokeless Tobacco Never   Counseling given: Not Answered   Outpatient Medications  Prior to Visit  Medication Sig Dispense Refill   desvenlafaxine (PRISTIQ) 50 MG 24 hr tablet Take 25 mg by mouth daily.     lidocaine  (XYLOCAINE ) 2 % solution Use as directed 15 mLs in the mouth or throat as needed for mouth pain. Gargle and spit as needed for throat pain. 100 mL 0   lisdexamfetamine (VYVANSE) 10 MG capsule Take 10 mg by mouth every morning. (Patient not taking: Reported on 10/15/2023)     lisdexamfetamine (VYVANSE) 40 MG capsule Take 40 mg by mouth every morning.     medroxyPROGESTERone  (DEPO-PROVERA ) 150 MG/ML injection Inject 1 mL (150 mg total) into the muscle every 3 (three) months. 1 mL 4   No facility-administered medications prior to visit.     Review of Systems: As above otherwise negative   Observations/Objective: Patient is well-developed, well-nourished in no acute distress.  Resting comfortably at home.  No labored breathing.  Speech is clear and coherent with logical content.  Patient is alert and oriented at baseline.   Assessment and Plan: Excessive daytime sleepiness She has snoring, excessive daytime sleepiness, restless sleep. BMI 27. HST was inconclusive without adequate amount of data. I am still concerned she could have sleep disordered breathing with obstructive sleep apnea. She will need an in lab sleep study for further evaluation.    - discussed how weight can impact sleep and risk for sleep disordered breathing - discussed options to assist with weight loss: combination of diet modification, cardiovascular and strength training exercises   - had an extensive discussion regarding the adverse health consequences related to untreated sleep disordered breathing - specifically discussed the risks for hypertension, coronary artery disease, cardiac dysrhythmias, cerebrovascular disease, and diabetes - lifestyle modification discussed   - discussed how sleep disruption can increase risk of accidents, particularly when driving - safe driving  practices were discussed  Patient Instructions  Sleep study at home did not have enough time to truly rule out or in sleep apnea. You did have some mild events, but not enough for insurance to approve therapy for. I recommend doing an in lab study for further evaluation. I have ordered this and someone should reach out in the next 6 weeks to schedule it. If not, please call my office  Start ambien  (zolpidem ) 5 mg At bedtime as needed for sleep. Take immediately before bed. Ensure you have 7-8 hours in the bed after taking. Monitor for any mood changes or changes in sleep habits. Stop and notify immediately if these occur. Do not drive or operate heavy machinery after taking. Do not take with alcohol or other sedating medications. Behar cause some morning grogginess or vivid dreams.  Ok to take the night of your study   Follow up 12/5 at 130pm with Katie Nancyjo Givhan,NP to review sleep study results and follow up on medication. If symptoms do not improve or worsen, please contact office for sooner follow up or seek emergency care.    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 Need to rule out OSA given hx of snoring and class III airway.  See above. Will trial her on pharmacological therapy with zolpidem  5 mg At bedtime PRN sleep. Side effect/safety profile reviewed. Aware this can worsen OSA, if present and untreated. Verbalized understanding. Aware to not drive after taking.     I discussed the assessment and treatment plan with the patient. The patient was provided an opportunity to ask questions and all were answered. The patient agreed with the plan and demonstrated an understanding of the instructions.   The patient was advised to call back or seek an in-person evaluation if the symptoms worsen or if the condition fails to improve as anticipated.  I provided 31 minutes of non-face-to-face time during this encounter.   Comer LULLA Rouleau, NP

## 2024-07-10 NOTE — Patient Instructions (Signed)
 Sleep study at home did not have enough time to truly rule out or in sleep apnea. You did have some mild events, but not enough for insurance to approve therapy for. I recommend doing an in lab study for further evaluation. I have ordered this and someone should reach out in the next 6 weeks to schedule it. If not, please call my office  Start ambien  (zolpidem ) 5 mg At bedtime as needed for sleep. Take immediately before bed. Ensure you have 7-8 hours in the bed after taking. Monitor for any mood changes or changes in sleep habits. Stop and notify immediately if these occur. Do not drive or operate heavy machinery after taking. Do not take with alcohol or other sedating medications. [place] cause some morning grogginess or vivid dreams.  Ok to take the night of your study   Follow up 12/5 at 130pm with Katie Levis Nazir,NP to review sleep study results and follow up on medication. If symptoms do not improve or worsen, please contact office for sooner follow up or seek emergency care.

## 2024-07-10 NOTE — Assessment & Plan Note (Signed)
 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 Need to rule out OSA given hx of snoring and class III airway. See above. Will trial her on pharmacological therapy with zolpidem  5 mg At bedtime PRN sleep. Side effect/safety profile reviewed. Aware this can worsen OSA, if present and untreated. Verbalized understanding. Aware to not drive after taking.

## 2024-07-15 NOTE — Telephone Encounter (Signed)
 Trial 2 tablets (10 mg total) and monitor response. High risk for side effects with high doses so monitor for any changes. Do not drive after taking. If still having difficulties, can try alternative medication. Thanks!

## 2024-08-12 ENCOUNTER — Other Ambulatory Visit: Payer: Self-pay | Admitting: Nurse Practitioner

## 2024-08-12 DIAGNOSIS — F5101 Primary insomnia: Secondary | ICD-10-CM

## 2024-08-12 NOTE — Telephone Encounter (Signed)
 Pt requesting refill of controlled medication, please advise thank you!  LOV: 06/01/24 Last Fill: 07/10/24

## 2024-08-12 NOTE — Telephone Encounter (Signed)
 Please verify dosing with pt and efficacy. She had sent a message last month about increasing to 2 tablets (10 mg) so wanting to ensure rx is correct and no side effects. Thanks!

## 2024-08-13 NOTE — Telephone Encounter (Signed)
 Pt returned my call and stated that she is taking 5mg . She tried increasing to 10mg  but states it made her feel queasy.  She informed that she has had no negative side effects on the regular dosage of 5mg .

## 2024-08-13 NOTE — Telephone Encounter (Signed)
 ATC x1. LVMTCB

## 2024-08-17 NOTE — Telephone Encounter (Signed)
 PDMP reviewed. Pt doing well on ambien  5 mg. No aberrant behavior or negative side effects.

## 2024-09-02 ENCOUNTER — Ambulatory Visit (HOSPITAL_BASED_OUTPATIENT_CLINIC_OR_DEPARTMENT_OTHER): Attending: Nurse Practitioner | Admitting: Pulmonary Disease

## 2024-09-02 DIAGNOSIS — G4719 Other hypersomnia: Secondary | ICD-10-CM | POA: Diagnosis present

## 2024-09-02 DIAGNOSIS — R0683 Snoring: Secondary | ICD-10-CM | POA: Insufficient documentation

## 2024-09-11 DIAGNOSIS — G4719 Other hypersomnia: Secondary | ICD-10-CM | POA: Diagnosis not present

## 2024-09-11 NOTE — Procedures (Signed)
 [facility] [address] [place], [place] [address] Tel: [phone]   Fax: [phone]  Polysomnography Interpretation  Patient Name:  Carolyn Ellis, Carolyn Ellis Study Date:  09/02/2024 Referring Physician:  KATHERINE COBB [phone]) %%startinterp%% Indications for Polysomnography The patient is a 28 year old Female who is 5' 5 and weighs 165.0 lbs. Her BMI equals 27.5.  A full night polysomnogram was performed to evaluate for hypersomnolence & snoring. The patient previously had a HSAT which showed an AHI of 10.7.  Medication taken at 2250.  ZOLPIDEM    Polysomnogram Data A full night polysomnogram recorded the standard physiologic parameters including EEG, EOG, EMG, EKG, nasal and oral airflow.  Respiratory parameters of chest and abdominal movements were recorded with Respiratory Inductance Plethysmography belts.  Oxygen saturation was recorded by pulse oximetry.   Sleep Architecture The total recording time of the polysomnogram was 374.9 minutes.  The total sleep time was 341.5 minutes.  The patient spent 3.2% of total sleep time in Stage N1, 44.2% in Stage N2, 33.4% in Stages N3, and 19.2% in REM.  Sleep latency was 11.3 minutes.  REM latency was 85.0 minutes.  Sleep Efficiency was 91.1%.  Wake after Sleep Onset time was 22.5 minutes.  Respiratory Events The polysomnogram revealed a presence of - obstructive, 4 central, and 1 mixed apneas resulting in an Apnea index of 0.9 events per hour.  There were 16 hypopneas (>=3% desaturation and/or arousal) resulting in an Apnea\Hypopnea Index (AHI >=3% desaturation and/or arousal) of 3.7 events per hour.  There were - hypopneas (>=4% desaturation) resulting in an Apnea\Hypopnea Index (AHI >=4% desaturation) of 0.9 events per hour.  There were 133 Respiratory Effort Related Arousals resulting in a RERA index of 23.4 events per hour. The Respiratory Disturbance Index is 27.1 events per hour.  The snore index was 61.5 events  per hour.  Mean oxygen saturation was 97.2%.  The lowest oxygen saturation during sleep was 94.0%.  Time spent <=88% oxygen saturation was - minutes (-).  Limb Activity There were 4 total limb movements recorded, of this total, 4 were classified as PLMs.  PLM index was 0.7 per hour and PLM associated with Arousals index was - per hour.  Cardiac Summary The average pulse rate was 83.8 bpm.  The minimum pulse rate was 62.0 bpm while the maximum pulse rate was 117.0 bpm.  Cardiac rhythm was normal/abnormal.  Comments: There were 4 REM periods & REM latency was 65 mins  Diagnosis: Hypersomnolence No etiology identified during this study  Recommendations: Proceed with MSLT testing after a 2 week period of stopping stimulants Maintain 2 week sleep diary prior to testing Encourage regular exercise and weight loss efforts Caution against driving when sleepy & against medications with sedative side effects.    This study was personally reviewed and electronically signed by: JUDE HARDEN GAILS., MD Accredited Board Certified in Sleep Medicine

## 2024-09-15 ENCOUNTER — Ambulatory Visit: Payer: Self-pay | Admitting: Nurse Practitioner

## 2024-09-15 ENCOUNTER — Ambulatory Visit (INDEPENDENT_AMBULATORY_CARE_PROVIDER_SITE_OTHER): Payer: Self-pay | Admitting: *Deleted

## 2024-09-15 DIAGNOSIS — Z3042 Encounter for surveillance of injectable contraceptive: Secondary | ICD-10-CM

## 2024-09-15 MED ORDER — MEDROXYPROGESTERONE ACETATE 150 MG/ML IM SUSY
150.0000 mg | PREFILLED_SYRINGE | Freq: Once | INTRAMUSCULAR | Status: AC
  Administered 2024-09-15: 150 mg via INTRAMUSCULAR

## 2024-09-15 NOTE — Progress Notes (Signed)
   NURSE VISIT- INJECTION  SUBJECTIVE:  Carolyn Ellis is a 28 y.o. G8P1001 female here for a Depo Provera  for contraception/period management. She is a GYN patient.   OBJECTIVE:  There were no vitals taken for this visit.  Appears well, in no apparent distress  Injection administered in: Left deltoid  Meds ordered this encounter  Medications   medroxyPROGESTERone  Acetate SUSY 150 mg    ASSESSMENT: GYN patient Depo Provera  for contraception/period management PLAN: Follow-up: in 11-13 weeks for next Depo   Alan LITTIE Fischer  09/15/2024 4:27 PM

## 2024-10-02 ENCOUNTER — Telehealth: Admitting: Nurse Practitioner

## 2024-10-02 ENCOUNTER — Encounter: Payer: Self-pay | Admitting: Nurse Practitioner

## 2024-10-02 DIAGNOSIS — G4700 Insomnia, unspecified: Secondary | ICD-10-CM

## 2024-10-02 DIAGNOSIS — G4710 Hypersomnia, unspecified: Secondary | ICD-10-CM

## 2024-10-02 DIAGNOSIS — G4719 Other hypersomnia: Secondary | ICD-10-CM | POA: Diagnosis not present

## 2024-10-02 NOTE — Assessment & Plan Note (Signed)
 She has persistent hypersomnia despite management of insomnia. No significant sleep apnea. Recommend MSLT for evaluation of narcolepsy. She will need to hold Pristiq and Vyvanse two weeks prior. Advised on proper tapering of Pristiq and to closely monitor for any mood changes. Reviewed risks of discontinuing SSRI/SNRI therapy. Verbalized understanding and aware to seek further care for any mood changes. Understands to not begin tapering off or discontinue Vyvanse until she is scheduled for sleep study, as to ensure she is only off therapy for 2 weeks.   - discussed how sleep disruption can increase risk of accidents, particularly when driving - safe driving practices were discussed  Patient Instructions  Your in lab sleep study did not show any significant sleep apnea. Given your persistent daytime fatigue and drowsiness, recommend ruling out narcolepsy with an in lab nap study. You will need to stay overnight in the lab and then have the nap study the following day.   In order to have a reliable study, you will need to hold your Vyvanse for 2 weeks prior to the study. We will also need to taper you off your Pristiq 2 weeks prior to the study. You can resume both immediately following.   For the Pristiq starting 4 weeks prior to your sleep study, take Pristiq every other day for a week then every 3rd day for a week then stop and remain off of it for the 2 weeks prior to your sleep study. If you have any worsening mood changes, notify immediately or seek further care   Continue ambien  (zolpidem ) 5 mg At bedtime as needed for sleep. Take immediately before bed. Ensure you have 7-8 hours in the bed after taking. Monitor for any mood changes or changes in sleep habits. Stop and notify immediately if these occur. Do not drive or operate heavy machinery after taking. Do not take with alcohol or other sedating medications. [place] cause some morning grogginess or vivid dreams.   Use extreme caution when driving  and pull over if you become sleepy. Do not drive drowsy. [place] have to avoid driving until symptoms adequately controlled.  Follow up in 12 weeks with Dr. Jude to review MSLT at [facility] office. If symptoms do not improve or worsen, please contact office for sooner follow up

## 2024-10-02 NOTE — Progress Notes (Signed)
 Patient ID: Carolyn Ellis, female     DOB: [birth year], 28 y.o.      MRN: [id number]  No chief complaint on file.   Virtual Visit via Video Note  I connected with Carolyn Ellis on 10/02/24 at  1:30 PM EST by a video enabled telemedicine application and verified that I am speaking with the correct person using two identifiers.  Location: Patient: Home Provider: Office   I discussed the limitations of evaluation and management by telemedicine and the availability of in person appointments. The patient expressed understanding and agreed to proceed.  History of Present Illness: 28 year old female, never smoker followed for insomnia and excessive daytime sleepiness. Past medical history significant for anxiety, ADD.    TEST/EVENTS:  06/12/2024 HST: AHI 3% 10.7/h, SpO2 low 93% 09/02/2024 NPSG: no significant OSA  06/01/2024: OV with Le Ferraz NP Carolyn BIRCH Willert is a 28 year old female who presents for a sleep consult. She was referred by her primary care doctor for a sleep consult. She has experienced difficulty with sleep for approximately three years, coinciding with the birth of her child. The primary issue is staying asleep. Doesn't usually have trouble falling asleep. No history of snoring, choking, gasping for air, or being told she stops breathing during sleep. She feels tired during the day but denies drowsy driving. No sleep parasomnias/paralysis.  She has a history of anxiety and ADD, managed with Pristiq and Vyvanse. Pristiq is taken in the morning, but Vyvanse has not been taken since quitting her job. She has not used prescribed sleep medications but has used Nyquil to aid sleep. She consumes caffeine, including one Red Bull in AM, but denies alcohol intake.  Her weight has remained stable since the birth of her child. She is not currently working and is seeking employment. She has a three-year-old son.  She goes to bed around 10 pm. Falls asleep quickly, usually. Doesn't seem to stay asleep for  longer than 4 hours then up 2-3 times. Gets up around 8-9 am. Lives with her brother and son. [place] [address]  07/10/2024: OV with [name] NP Carolyn Ellis is a 28 year old female who presents with sleep disturbances. She experiences difficulty staying asleep at night and feels tired during the day. These symptoms have been ongoing for a while and worsened after the birth of her son. A home sleep study only obtained 90 minutes of data to be analyzed. She had a 4% score without significant OSA but her 3% scoring showed mild OSA. Her oxygen levels did not drop significantly during the study. She felt like she kept waking up with the machine and struggled to make it through the night without taking it off.  She continues to have disruptions in night time sleep and struggles to sleep for long stretches. She has tried over-the-counter sleep aids, including a 25 mg [employer] brand sleep medication and a nighttime cough medicine, which provided temporary relief but did not sustain sleep throughout the night. No history of sleepwalking. No mood changes. No drowsy driving. No significant changes from prior OV.   10/02/2024: Today - follow up Discussed the use of AI scribe software for clinical note transcription with the patient, who gave verbal consent to proceed.  History of Present Illness Carolyn Ellis is a 28 year old female who presents with persistent daytime sleepiness despite treatment for insomnia.  She had an in lab sleep study without any significant OSA. She was started on Ambien  at  our last OV, which does help with her nighttime sleep but despite this, she continues to experiences persistent daytime sleepiness. Wakes up tired. Has issues with dozing off during the day and feeling drowsy while driving. No sleep parasomnias. Tolerating ambien  well without any side effects.   She is currently taking Vyvanse and Pristiq. She does not take Vyvanse on weekends. She feels her anxiety and depression are well-managed  and believes she could manage without Pristiq for a few weeks.     Allergies  Allergen Reactions   Unasyn  [Ampicillin -Sulbactam Sodium ] Rash   Adderall [Amphetamine-Dextroamphetamine] Other (See Comments)    Hyperactivity and irritability   Augmentin [Amoxicillin-Pot Clavulanate] Rash    Ampicillin  tolerated during 10/6 admission with no AE   Penicillins Rash    Ampicillin  tolerated during 10/6 admission with no AE   Immunization History  Administered Date(s) Administered   Tdap 06/07/2020   Past Medical History:  Diagnosis Date   Abnormal Pap smear of cervix    ASCUS    Tobacco History: Social History   Tobacco Use  Smoking Status Never  Smokeless Tobacco Never   Counseling given: Not Answered   Outpatient Medications Prior to Visit  Medication Sig Dispense Refill   desvenlafaxine (PRISTIQ) 50 MG 24 hr tablet Take 25 mg by mouth daily.     lidocaine  (XYLOCAINE ) 2 % solution Use as directed 15 mLs in the mouth or throat as needed for mouth pain. Gargle and spit as needed for throat pain. 100 mL 0   lisdexamfetamine (VYVANSE) 10 MG capsule Take 10 mg by mouth every morning. (Patient not taking: Reported on 10/15/2023)     lisdexamfetamine (VYVANSE) 40 MG capsule Take 40 mg by mouth every morning.     medroxyPROGESTERone  (DEPO-PROVERA ) 150 MG/ML injection Inject 1 mL (150 mg total) into the muscle every 3 (three) months. 1 mL 4   zolpidem  (AMBIEN ) 5 MG tablet TAKE 1 TABLET BY MOUTH AT BEDTIME AS NEEDED FOR SLEEP 30 tablet 5   No facility-administered medications prior to visit.     Review of Systems: As above otherwise negative   Observations/Objective: Patient is well-developed, well-nourished in no acute distress.  Resting comfortably at home.  No labored breathing.  Speech is clear and coherent with logical content.  Patient is alert and oriented at baseline.   Assessment and Plan: Excessive daytime sleepiness She has persistent hypersomnia despite  management of insomnia. No significant sleep apnea. Recommend MSLT for evaluation of narcolepsy. She will need to hold Pristiq and Vyvanse two weeks prior. Advised on proper tapering of Pristiq and to closely monitor for any mood changes. Reviewed risks of discontinuing SSRI/SNRI therapy. Verbalized understanding and aware to seek further care for any mood changes. Understands to not begin tapering off or discontinue Vyvanse until she is scheduled for sleep study, as to ensure she is only off therapy for 2 weeks.   - discussed how sleep disruption can increase risk of accidents, particularly when driving - safe driving practices were discussed  Patient Instructions  Your in lab sleep study did not show any significant sleep apnea. Given your persistent daytime fatigue and drowsiness, recommend ruling out narcolepsy with an in lab nap study. You will need to stay overnight in the lab and then have the nap study the following day.   In order to have a reliable study, you will need to hold your Vyvanse for 2 weeks prior to the study. We will also need to taper you off your Pristiq 2  weeks prior to the study. You can resume both immediately following.   For the Pristiq starting 4 weeks prior to your sleep study, take Pristiq every other day for a week then every 3rd day for a week then stop and remain off of it for the 2 weeks prior to your sleep study. If you have any worsening mood changes, notify immediately or seek further care   Continue ambien  (zolpidem ) 5 mg At bedtime as needed for sleep. Take immediately before bed. Ensure you have 7-8 hours in the bed after taking. Monitor for any mood changes or changes in sleep habits. Stop and notify immediately if these occur. Do not drive or operate heavy machinery after taking. Do not take with alcohol or other sedating medications. [place] cause some morning grogginess or vivid dreams.   Use extreme caution when driving and pull over if you become sleepy. Do  not drive drowsy. Mey have to avoid driving until symptoms adequately controlled.  Follow up in 12 weeks with Dr. Jude to review MSLT at [facility] office. If symptoms do not improve or worsen, please contact office for sooner follow up    Insomnia Sleep maintenance insomnia. Improved sleep quality with use of sedative. Continue zolpidem . Side effect/safety profile reviewed. Verbalized understanding. Aware to not drive after taking.      I discussed the assessment and treatment plan with the patient. The patient was provided an opportunity to ask questions and all were answered. The patient agreed with the plan and demonstrated an understanding of the instructions.   The patient was advised to call back or seek an in-person evaluation if the symptoms worsen or if the condition fails to improve as anticipated.  I provided 31 minutes of non-face-to-face time during this encounter.   Comer LULLA Rouleau, NP

## 2024-10-02 NOTE — Assessment & Plan Note (Signed)
 Sleep maintenance insomnia. Improved sleep quality with use of sedative. Continue zolpidem . Side effect/safety profile reviewed. Verbalized understanding. Aware to not drive after taking.

## 2024-10-02 NOTE — Patient Instructions (Addendum)
 Your in lab sleep study did not show any significant sleep apnea. Given your persistent daytime fatigue and drowsiness, recommend ruling out narcolepsy with an in lab nap study. You will need to stay overnight in the lab and then have the nap study the following day.   In order to have a reliable study, you will need to hold your Vyvanse for 2 weeks prior to the study. We will also need to taper you off your Pristiq 2 weeks prior to the study. You can resume both immediately following.   For the Pristiq starting 4 weeks prior to your sleep study, take Pristiq every other day for a week then every 3rd day for a week then stop and remain off of it for the 2 weeks prior to your sleep study. If you have any worsening mood changes, notify immediately or seek further care   Continue ambien  (zolpidem ) 5 mg At bedtime as needed for sleep. Take immediately before bed. Ensure you have 7-8 hours in the bed after taking. Monitor for any mood changes or changes in sleep habits. Stop and notify immediately if these occur. Do not drive or operate heavy machinery after taking. Do not take with alcohol or other sedating medications.  cause some morning grogginess or vivid dreams.   Use extreme caution when driving and pull over if you become sleepy. Do not drive drowsy. [place] have to avoid driving until symptoms adequately controlled.  Follow up in 12 weeks with Dr. Jude to review MSLT at [place] office. If symptoms do not improve or worsen, please contact office for sooner follow up

## 2024-12-02 ENCOUNTER — Ambulatory Visit: Admitting: *Deleted

## 2024-12-02 DIAGNOSIS — Z3042 Encounter for surveillance of injectable contraceptive: Secondary | ICD-10-CM | POA: Diagnosis not present

## 2024-12-02 MED ORDER — MEDROXYPROGESTERONE ACETATE 150 MG/ML IM SUSY
150.0000 mg | PREFILLED_SYRINGE | Freq: Once | INTRAMUSCULAR | Status: AC
  Administered 2024-12-02: 150 mg via INTRAMUSCULAR

## 2024-12-02 NOTE — Progress Notes (Signed)
" ° °  NURSE VISIT- INJECTION  SUBJECTIVE:  Carolyn Ellis is a 29 y.o. G49P1001 female here for a Depo Provera  for contraception/period management. She is a GYN patient.   OBJECTIVE:  There were no vitals taken for this visit.  Appears well, in no apparent distress  Injection administered in: Right deltoid  Meds ordered this encounter  Medications   medroxyPROGESTERone  Acetate SUSY 150 mg    ASSESSMENT: GYN patient Depo Provera  for contraception/period management PLAN: Follow-up: in 11-13 weeks for next Depo   Carolyn Ellis  12/02/2024 4:19 PM  "

## 2024-12-15 ENCOUNTER — Ambulatory Visit (HOSPITAL_BASED_OUTPATIENT_CLINIC_OR_DEPARTMENT_OTHER): Admitting: Pulmonary Disease

## 2024-12-16 ENCOUNTER — Ambulatory Visit (HOSPITAL_BASED_OUTPATIENT_CLINIC_OR_DEPARTMENT_OTHER): Admitting: Pulmonary Disease

## 2025-01-27 ENCOUNTER — Ambulatory Visit: Admitting: Obstetrics & Gynecology

## 2025-02-24 ENCOUNTER — Ambulatory Visit
